# Patient Record
Sex: Female | Born: 1960 | Race: White | Hispanic: No | Marital: Married | Smoking: Never smoker
Health system: Southern US, Community
[De-identification: ages and names within clinical notes are randomized; demographics above are authoritative.]

## PROBLEM LIST (undated history)

## (undated) DIAGNOSIS — IMO0002 Reserved for concepts with insufficient information to code with codable children: Secondary | ICD-10-CM

## (undated) DIAGNOSIS — Z90710 Acquired absence of both cervix and uterus: Secondary | ICD-10-CM

## (undated) DIAGNOSIS — M8580 Other specified disorders of bone density and structure, unspecified site: Secondary | ICD-10-CM

## (undated) DIAGNOSIS — I729 Aneurysm of unspecified site: Secondary | ICD-10-CM

## (undated) DIAGNOSIS — N390 Urinary tract infection, site not specified: Secondary | ICD-10-CM

## (undated) DIAGNOSIS — R569 Unspecified convulsions: Secondary | ICD-10-CM

## (undated) DIAGNOSIS — T7840XA Allergy, unspecified, initial encounter: Secondary | ICD-10-CM

## (undated) HISTORY — DX: Allergy, unspecified, initial encounter: T78.40XA

## (undated) HISTORY — DX: Acquired absence of both cervix and uterus: Z90.710

## (undated) HISTORY — DX: Aneurysm of unspecified site: I72.9

## (undated) HISTORY — DX: Reserved for concepts with insufficient information to code with codable children: IMO0002

---

## 1999-05-04 ENCOUNTER — Ambulatory Visit (HOSPITAL_COMMUNITY): Admission: RE | Admit: 1999-05-04 | Discharge: 1999-05-04 | Payer: Self-pay | Admitting: Family Medicine

## 2005-10-14 ENCOUNTER — Other Ambulatory Visit: Admission: RE | Admit: 2005-10-14 | Discharge: 2005-10-14 | Payer: Self-pay | Admitting: Obstetrics and Gynecology

## 2006-08-25 ENCOUNTER — Ambulatory Visit (HOSPITAL_COMMUNITY): Admission: RE | Admit: 2006-08-25 | Discharge: 2006-08-25 | Payer: Self-pay | Admitting: Obstetrics and Gynecology

## 2007-02-02 ENCOUNTER — Other Ambulatory Visit: Admission: RE | Admit: 2007-02-02 | Discharge: 2007-02-02 | Payer: Self-pay | Admitting: Obstetrics and Gynecology

## 2007-03-08 HISTORY — PX: CHOLECYSTECTOMY: SHX55

## 2007-03-20 ENCOUNTER — Ambulatory Visit (HOSPITAL_COMMUNITY): Admission: RE | Admit: 2007-03-20 | Discharge: 2007-03-20 | Payer: Self-pay | Admitting: Family Medicine

## 2008-02-19 ENCOUNTER — Other Ambulatory Visit: Admission: RE | Admit: 2008-02-19 | Discharge: 2008-02-19 | Payer: Self-pay | Admitting: Obstetrics & Gynecology

## 2009-11-07 DIAGNOSIS — R569 Unspecified convulsions: Secondary | ICD-10-CM

## 2009-11-07 HISTORY — DX: Unspecified convulsions: R56.9

## 2010-03-07 HISTORY — PX: CEREBRAL ANEURYSM REPAIR: SHX164

## 2010-03-24 ENCOUNTER — Emergency Department (HOSPITAL_COMMUNITY): Admission: EM | Admit: 2010-03-24 | Discharge: 2010-03-24 | Payer: Self-pay | Admitting: Emergency Medicine

## 2011-01-24 LAB — DIFFERENTIAL
Eosinophils Absolute: 0 10*3/uL (ref 0.0–0.7)
Eosinophils Relative: 0 % (ref 0–5)
Lymphocytes Relative: 6 % — ABNORMAL LOW (ref 12–46)
Lymphs Abs: 0.9 10*3/uL (ref 0.7–4.0)
Monocytes Relative: 5 % (ref 3–12)
Neutrophils Relative %: 89 % — ABNORMAL HIGH (ref 43–77)

## 2011-01-24 LAB — POCT I-STAT, CHEM 8
BUN: 9 mg/dL (ref 6–23)
Creatinine, Ser: 0.6 mg/dL (ref 0.4–1.2)
Glucose, Bld: 126 mg/dL — ABNORMAL HIGH (ref 70–99)
Hemoglobin: 13.6 g/dL (ref 12.0–15.0)
Potassium: 3.4 mEq/L — ABNORMAL LOW (ref 3.5–5.1)
Sodium: 136 mEq/L (ref 135–145)

## 2011-01-24 LAB — CBC
HCT: 39.3 % (ref 36.0–46.0)
MCV: 84.2 fL (ref 78.0–100.0)
RBC: 4.66 MIL/uL (ref 3.87–5.11)
WBC: 14.2 10*3/uL — ABNORMAL HIGH (ref 4.0–10.5)

## 2011-01-24 LAB — APTT: aPTT: 22 seconds — ABNORMAL LOW (ref 24–37)

## 2011-01-24 LAB — PROTIME-INR: INR: 1.05 (ref 0.00–1.49)

## 2011-03-26 DIAGNOSIS — I729 Aneurysm of unspecified site: Secondary | ICD-10-CM

## 2011-03-26 HISTORY — DX: Aneurysm of unspecified site: I72.9

## 2011-11-18 DIAGNOSIS — G40001 Localization-related (focal) (partial) idiopathic epilepsy and epileptic syndromes with seizures of localized onset, not intractable, with status epilepticus: Secondary | ICD-10-CM | POA: Insufficient documentation

## 2012-04-24 ENCOUNTER — Other Ambulatory Visit: Payer: Self-pay | Admitting: Obstetrics and Gynecology

## 2012-04-24 DIAGNOSIS — Z1231 Encounter for screening mammogram for malignant neoplasm of breast: Secondary | ICD-10-CM

## 2012-04-24 LAB — HM PAP SMEAR: HM Pap smear: NEGATIVE

## 2012-05-11 ENCOUNTER — Ambulatory Visit
Admission: RE | Admit: 2012-05-11 | Discharge: 2012-05-11 | Disposition: A | Discharge status: Home / Self Care | Payer: 59 | Source: Ambulatory Visit | Attending: Obstetrics and Gynecology | Admitting: Obstetrics and Gynecology

## 2012-05-11 DIAGNOSIS — Z1231 Encounter for screening mammogram for malignant neoplasm of breast: Secondary | ICD-10-CM

## 2012-05-11 LAB — HM MAMMOGRAPHY: HM Mammogram: NORMAL

## 2012-11-28 ENCOUNTER — Encounter (HOSPITAL_COMMUNITY): Payer: Self-pay | Admitting: *Deleted

## 2012-12-03 ENCOUNTER — Encounter (HOSPITAL_COMMUNITY): Payer: Self-pay | Admitting: Obstetrics and Gynecology

## 2012-12-03 MED ORDER — DEXTROSE 5 % IV SOLN
2.0000 g | INTRAVENOUS | Status: AC
  Administered 2012-12-04: 2 g via INTRAVENOUS
  Filled 2012-12-03: qty 2

## 2012-12-03 NOTE — H&P (Signed)
Traci [name] is an 52 y.o. female. With menorrhagia unresponsive to medical management.  PUS showed uterus 9 x 6 x 6 cm with 3.5 cm submucous fibroid and two other intramural fibroids, 2 cm and 1 cm.  Endo bx was benign.  Here for difinitive tx with hysterectomy.    Pertinent Gynecological History: Menses: flow is excessive with use of changes pads every 30-6- minutes on heaviest days and has clots and flooding. pads or tampons on heaviest days Bleeding: dysfunctional uterine bleeding Contraception: OCP (estrogen/progesterone) DES exposure: denies Blood transfusions: none Sexually transmitted diseases: no past history Previous GYN Procedures: none  Last mammogram: normal Date: July 2013 Last pap: normal Date: June 2013 OB History: G2, P2   Menstrual History:  No LMP recorded. Patient is not currently having periods (Reason: Oral contraceptives).    Past Medical History  Diagnosis Date  . 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 UTI (lower urinary tract infection)     current with antibiotics    Past Surgical History  Procedure Date  . Cholecystectomy 2008  . 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 Maintained on Keppra-no seizure activity since surgery    History reviewed. No pertinent family history.  Social History:  reports that she has never smoked. She does not have any smokeless tobacco history on file. She reports that she drinks alcohol. She reports that she does not use illicit drugs.  Allergies: Allergies not on file  No prescriptions prior to admission    Review of Systems  All other systems reviewed and are negative.    Height 5' (1.524 m), weight 68.04 kg (150 lb). Physical Exam  Constitutional: She is oriented to person, place, and time. She appears well-developed and well-nourished.  HENT:  Head: Normocephalic and atraumatic.  Eyes: Conjunctivae normal are normal. Pupils are equal, round, and reactive to light.  Neck:  Normal range of motion. Neck supple. No tracheal deviation present. No thyromegaly present.  Cardiovascular: Normal rate, regular rhythm and normal heart sounds.   Respiratory: Effort normal and breath sounds normal. No respiratory distress. She has no wheezes. She has no rales.  GI: Soft. Bowel sounds are normal. She exhibits no distension and no mass. There is no tenderness. There is no rebound and no guarding.  Genitourinary: Vagina normal and uterus normal.  Musculoskeletal: Normal range of motion.  Neurological: She is alert and oriented to person, place, and time.  Skin: Skin is warm and dry.  Psychiatric: She has a normal mood and affect. Her behavior is normal.    No results found for this or any previous visit (from the past 24 hour(s)).  No results found.  Assessment/Plan: Menorrhagia, plan for R-TLH, retain ovaries but remove tubes.    ROMINE,[name] 12/03/2012, 11:56 AM

## 2012-12-04 ENCOUNTER — Encounter (HOSPITAL_COMMUNITY)
Admission: RE | Disposition: A | Discharge status: Home / Self Care | Payer: Self-pay | Source: Ambulatory Visit | Attending: Obstetrics and Gynecology

## 2012-12-04 ENCOUNTER — Encounter (HOSPITAL_COMMUNITY): Payer: Self-pay | Admitting: *Deleted

## 2012-12-04 ENCOUNTER — Ambulatory Visit (HOSPITAL_COMMUNITY)
Admission: RE | Admit: 2012-12-04 | Discharge: 2012-12-04 | Disposition: A | Discharge status: Home / Self Care | Payer: 59 | Source: Ambulatory Visit | Attending: Obstetrics and Gynecology | Admitting: Obstetrics and Gynecology

## 2012-12-04 ENCOUNTER — Encounter (HOSPITAL_COMMUNITY): Payer: Self-pay | Admitting: Pharmacy Technician

## 2012-12-04 ENCOUNTER — Encounter (HOSPITAL_COMMUNITY): Payer: Self-pay | Admitting: Anesthesiology

## 2012-12-04 ENCOUNTER — Ambulatory Visit (HOSPITAL_COMMUNITY): Payer: 59 | Admitting: Anesthesiology

## 2012-12-04 DIAGNOSIS — N920 Excessive and frequent menstruation with regular cycle: Secondary | ICD-10-CM | POA: Insufficient documentation

## 2012-12-04 DIAGNOSIS — D250 Submucous leiomyoma of uterus: Secondary | ICD-10-CM | POA: Insufficient documentation

## 2012-12-04 DIAGNOSIS — Z90710 Acquired absence of both cervix and uterus: Secondary | ICD-10-CM

## 2012-12-04 HISTORY — DX: Unspecified convulsions: R56.9

## 2012-12-04 HISTORY — DX: Urinary tract infection, site not specified: N39.0

## 2012-12-04 HISTORY — DX: Acquired absence of both cervix and uterus: Z90.710

## 2012-12-04 HISTORY — PX: ROBOTIC ASSISTED TOTAL HYSTERECTOMY: SHX6085

## 2012-12-04 HISTORY — PX: CYSTOSCOPY: SHX5120

## 2012-12-04 LAB — CBC
HCT: 32.1 % — ABNORMAL LOW (ref 36.0–46.0)
Hemoglobin: 9.8 g/dL — ABNORMAL LOW (ref 12.0–15.0)
MCHC: 30.5 g/dL (ref 30.0–36.0)
RBC: 4.12 MIL/uL (ref 3.87–5.11)

## 2012-12-04 LAB — COMPREHENSIVE METABOLIC PANEL
ALT: 10 U/L (ref 0–35)
Alkaline Phosphatase: 63 U/L (ref 39–117)
BUN: 12 mg/dL (ref 6–23)
CO2: 25 mEq/L (ref 19–32)
Chloride: 105 mEq/L (ref 96–112)
GFR calc Af Amer: >90 mL/min (ref >90)
GFR calc non Af Amer: >90 mL/min (ref >90)
Glucose, Bld: 95 mg/dL (ref 70–99)
Potassium: 4.1 mEq/L (ref 3.5–5.1)
Sodium: 138 mEq/L (ref 135–145)
Total Bilirubin: 0.3 mg/dL (ref 0.3–1.2)

## 2012-12-04 LAB — SURGICAL PCR SCREEN: MRSA, PCR: NEGATIVE

## 2012-12-04 SURGERY — ROBOTIC ASSISTED TOTAL HYSTERECTOMY
Anesthesia: General | Site: Urethra | Wound class: Clean Contaminated

## 2012-12-04 MED ORDER — LACTATED RINGERS IR SOLN
Status: DC | PRN
  Administered 2012-12-04: 3000 mL

## 2012-12-04 MED ORDER — DEXTROSE IN LACTATED RINGERS 5 % IV SOLN: INTRAVENOUS | Status: DC

## 2012-12-04 MED ORDER — GLYCOPYRROLATE 0.2 MG/ML IJ SOLN
INTRAMUSCULAR | Status: AC
  Filled 2012-12-04: qty 2

## 2012-12-04 MED ORDER — HYDROMORPHONE HCL PF 1 MG/ML IJ SOLN
INTRAMUSCULAR | Status: AC
  Filled 2012-12-04: qty 1

## 2012-12-04 MED ORDER — KETOROLAC TROMETHAMINE 30 MG/ML IJ SOLN: 15.0000 mg | Freq: Once | INTRAMUSCULAR | Status: DC | PRN

## 2012-12-04 MED ORDER — ACETAMINOPHEN 10 MG/ML IV SOLN
INTRAVENOUS | Status: AC
  Filled 2012-12-04: qty 100

## 2012-12-04 MED ORDER — ACETAMINOPHEN 10 MG/ML IV SOLN: 1000.0000 mg | Freq: Once | INTRAVENOUS | Status: DC

## 2012-12-04 MED ORDER — MORPHINE SULFATE 4 MG/ML IJ SOLN
1.0000 mg | INTRAMUSCULAR | Status: DC | PRN
  Administered 2012-12-04: 2 mg via INTRAVENOUS
  Filled 2012-12-04: qty 1

## 2012-12-04 MED ORDER — ROCURONIUM BROMIDE 50 MG/5ML IV SOLN
INTRAVENOUS | Status: AC
  Filled 2012-12-04: qty 1

## 2012-12-04 MED ORDER — MIDAZOLAM HCL 2 MG/2ML IJ SOLN
INTRAMUSCULAR | Status: AC
  Filled 2012-12-04: qty 2

## 2012-12-04 MED ORDER — OXYCODONE-ACETAMINOPHEN 5-325 MG PO TABS: 1.0000 | ORAL_TABLET | ORAL | Status: DC | PRN

## 2012-12-04 MED ORDER — ROPIVACAINE HCL 5 MG/ML IJ SOLN
INTRAMUSCULAR | Status: AC
  Filled 2012-12-04: qty 60

## 2012-12-04 MED ORDER — NEOSTIGMINE METHYLSULFATE 1 MG/ML IJ SOLN
INTRAMUSCULAR | Status: AC
  Filled 2012-12-04: qty 1

## 2012-12-04 MED ORDER — FENTANYL CITRATE 0.05 MG/ML IJ SOLN
INTRAMUSCULAR | Status: AC
  Filled 2012-12-04: qty 5

## 2012-12-04 MED ORDER — NEOSTIGMINE METHYLSULFATE 1 MG/ML IJ SOLN
INTRAMUSCULAR | Status: DC | PRN
  Administered 2012-12-04: 3 mg via INTRAVENOUS

## 2012-12-04 MED ORDER — MIDAZOLAM HCL 5 MG/5ML IJ SOLN
INTRAMUSCULAR | Status: DC | PRN
  Administered 2012-12-04: 2 mg via INTRAVENOUS

## 2012-12-04 MED ORDER — LIDOCAINE HCL (CARDIAC) 20 MG/ML IV SOLN
INTRAVENOUS | Status: DC | PRN
  Administered 2012-12-04: 80 mg via INTRAVENOUS

## 2012-12-04 MED ORDER — MUPIROCIN 2 % EX OINT: TOPICAL_OINTMENT | Freq: Two times a day (BID) | CUTANEOUS | Status: DC

## 2012-12-04 MED ORDER — MUPIROCIN 2 % EX OINT
TOPICAL_OINTMENT | CUTANEOUS | Status: AC
  Administered 2012-12-04: 1
  Filled 2012-12-04: qty 22

## 2012-12-04 MED ORDER — SODIUM CHLORIDE 0.9 % IJ SOLN
INTRAMUSCULAR | Status: DC | PRN
  Administered 2012-12-04: 60 mL

## 2012-12-04 MED ORDER — DEXAMETHASONE SODIUM PHOSPHATE 4 MG/ML IJ SOLN
INTRAMUSCULAR | Status: DC | PRN
  Administered 2012-12-04: 10 mg via INTRAVENOUS

## 2012-12-04 MED ORDER — ONDANSETRON HCL 4 MG/2ML IJ SOLN
INTRAMUSCULAR | Status: DC | PRN
  Administered 2012-12-04: 4 mg via INTRAVENOUS

## 2012-12-04 MED ORDER — ONDANSETRON HCL 4 MG/2ML IJ SOLN
INTRAMUSCULAR | Status: AC
  Filled 2012-12-04: qty 2

## 2012-12-04 MED ORDER — STERILE WATER FOR IRRIGATION IR SOLN
Status: DC | PRN
  Administered 2012-12-04: 1000 mL via INTRAVESICAL

## 2012-12-04 MED ORDER — FENTANYL CITRATE 0.05 MG/ML IJ SOLN
INTRAMUSCULAR | Status: DC | PRN
  Administered 2012-12-04 (×2): 100 ug via INTRAVENOUS
  Administered 2012-12-04: 50 ug via INTRAVENOUS

## 2012-12-04 MED ORDER — INDIGOTINDISULFONATE SODIUM 8 MG/ML IJ SOLN
INTRAMUSCULAR | Status: AC
  Filled 2012-12-04: qty 5

## 2012-12-04 MED ORDER — HYDROMORPHONE HCL PF 1 MG/ML IJ SOLN
INTRAMUSCULAR | Status: DC | PRN
  Administered 2012-12-04: 1 mg via INTRAVENOUS

## 2012-12-04 MED ORDER — GLYCOPYRROLATE 0.2 MG/ML IJ SOLN
INTRAMUSCULAR | Status: DC | PRN
  Administered 2012-12-04: 0.4 mg via INTRAVENOUS

## 2012-12-04 MED ORDER — LIDOCAINE HCL (CARDIAC) 20 MG/ML IV SOLN
INTRAVENOUS | Status: AC
  Filled 2012-12-04: qty 5

## 2012-12-04 MED ORDER — LACTATED RINGERS IV SOLN
INTRAVENOUS | Status: DC
  Administered 2012-12-04 (×3): via INTRAVENOUS

## 2012-12-04 MED ORDER — FENTANYL CITRATE 0.05 MG/ML IJ SOLN: 25.0000 ug | INTRAMUSCULAR | Status: DC | PRN

## 2012-12-04 MED ORDER — ACETAMINOPHEN 10 MG/ML IV SOLN
INTRAVENOUS | Status: DC | PRN
  Administered 2012-12-04: 1000 mg via INTRAVENOUS

## 2012-12-04 MED ORDER — PROPOFOL 10 MG/ML IV EMUL
INTRAVENOUS | Status: DC | PRN
  Administered 2012-12-04: 160 mg via INTRAVENOUS

## 2012-12-04 MED ORDER — ARTIFICIAL TEARS OP OINT
TOPICAL_OINTMENT | OPHTHALMIC | Status: AC
  Filled 2012-12-04: qty 3.5

## 2012-12-04 MED ORDER — INDIGOTINDISULFONATE SODIUM 8 MG/ML IJ SOLN
INTRAMUSCULAR | Status: DC | PRN
  Administered 2012-12-04: 3 mL via INTRAVENOUS

## 2012-12-04 MED ORDER — DEXAMETHASONE SODIUM PHOSPHATE 10 MG/ML IJ SOLN
INTRAMUSCULAR | Status: AC
  Filled 2012-12-04: qty 1

## 2012-12-04 MED ORDER — ROPIVACAINE HCL 5 MG/ML IJ SOLN
INTRAMUSCULAR | Status: DC | PRN
  Administered 2012-12-04: 80 mL

## 2012-12-04 MED ORDER — ROCURONIUM BROMIDE 100 MG/10ML IV SOLN
INTRAVENOUS | Status: DC | PRN
  Administered 2012-12-04: 50 mg via INTRAVENOUS
  Administered 2012-12-04: 25 mg via INTRAVENOUS

## 2012-12-04 MED ORDER — PROPOFOL 10 MG/ML IV EMUL
INTRAVENOUS | Status: AC
  Filled 2012-12-04: qty 20

## 2012-12-04 SURGICAL SUPPLY — 71 items
BAG URINE DRAINAGE (UROLOGICAL SUPPLIES) ×3 IMPLANT
BARRIER ADHS 3X4 INTERCEED (GAUZE/BANDAGES/DRESSINGS) ×3 IMPLANT
BENZOIN TINCTURE PRP APPL 2/3 (GAUZE/BANDAGES/DRESSINGS) ×3 IMPLANT
CHLORAPREP W/TINT 26ML (MISCELLANEOUS) ×3 IMPLANT
CONT PATH 16OZ SNAP LID 3702 (MISCELLANEOUS) ×3 IMPLANT
COVER MAYO STAND STRL (DRAPES) ×3 IMPLANT
COVER TABLE BACK 60X90 (DRAPES) ×6 IMPLANT
COVER TIP SHEARS 8 DVNC (MISCELLANEOUS) ×2 IMPLANT
COVER TIP SHEARS 8MM DA VINCI (MISCELLANEOUS) ×1
DECANTER SPIKE VIAL GLASS SM (MISCELLANEOUS) ×3 IMPLANT
DERMABOND ADVANCED (GAUZE/BANDAGES/DRESSINGS) ×1
DERMABOND ADVANCED .7 DNX12 (GAUZE/BANDAGES/DRESSINGS) ×2 IMPLANT
DRAPE HUG U DISPOSABLE (DRAPE) ×3 IMPLANT
DRAPE LG THREE QUARTER DISP (DRAPES) ×6 IMPLANT
DRAPE WARM FLUID 44X44 (DRAPE) ×3 IMPLANT
ELECT REM PT RETURN 9FT ADLT (ELECTROSURGICAL) ×3
ELECTRODE REM PT RTRN 9FT ADLT (ELECTROSURGICAL) ×2 IMPLANT
EVACUATOR SMOKE 8.L (FILTER) ×3 IMPLANT
GAUZE VASELINE 3X9 (GAUZE/BANDAGES/DRESSINGS) IMPLANT
GLOVE BIO SURGEON STRL SZ 6.5 (GLOVE) ×9 IMPLANT
GLOVE BIOGEL PI IND STRL 7.0 (GLOVE) ×8 IMPLANT
GLOVE BIOGEL PI INDICATOR 7.0 (GLOVE) ×4
GLOVE ECLIPSE 6.5 STRL STRAW (GLOVE) ×12 IMPLANT
GOWN STRL REIN XL XLG (GOWN DISPOSABLE) ×24 IMPLANT
GYRUS RUMI II 2.5CM BLUE (DISPOSABLE)
GYRUS RUMI II 3.5CM BLUE (DISPOSABLE)
GYRUS RUMI II 4.0CM BLUE (DISPOSABLE)
KIT ACCESSORY DA VINCI DISP (KITS) ×1
KIT ACCESSORY DVNC DISP (KITS) ×2 IMPLANT
LEGGING LITHOTOMY PAIR STRL (DRAPES) ×3 IMPLANT
NEEDLE INSUFFLATION 14GA 120MM (NEEDLE) ×3 IMPLANT
OCCLUDER COLPOPNEUMO (BALLOONS) ×3 IMPLANT
PACK LAVH (CUSTOM PROCEDURE TRAY) ×3 IMPLANT
PAD PREP 24X48 CUFFED NSTRL (MISCELLANEOUS) ×6 IMPLANT
PLUG CATH AND CAP STER (CATHETERS) ×3 IMPLANT
PROTECTOR NERVE ULNAR (MISCELLANEOUS) ×6 IMPLANT
RUMI II 3.0CM BLUE KOH-EFFICIE (DISPOSABLE) IMPLANT
RUMI II GYRUS 2.5CM BLUE (DISPOSABLE) IMPLANT
RUMI II GYRUS 3.5CM BLUE (DISPOSABLE) IMPLANT
RUMI II GYRUS 4.0CM BLUE (DISPOSABLE) IMPLANT
SET CYSTO W/LG BORE CLAMP LF (SET/KITS/TRAYS/PACK) ×3 IMPLANT
SET IRRIG TUBING LAPAROSCOPIC (IRRIGATION / IRRIGATOR) ×3 IMPLANT
SOLUTION ELECTROLUBE (MISCELLANEOUS) ×3 IMPLANT
STRIP CLOSURE SKIN 1/4X4 (GAUZE/BANDAGES/DRESSINGS) ×3 IMPLANT
SUT VIC AB 0 CT1 27 (SUTURE) ×1
SUT VIC AB 0 CT1 27XBRD ANBCTR (SUTURE) ×2 IMPLANT
SUT VIC AB 0 CT1 27XBRD ANTBC (SUTURE) IMPLANT
SUT VICRYL 0 UR6 27IN ABS (SUTURE) ×3 IMPLANT
SUT VICRYL RAPIDE 4/0 PS 2 (SUTURE) ×6 IMPLANT
SUT VLOC 180 0 9IN  GS21 (SUTURE) ×1
SUT VLOC 180 0 9IN GS21 (SUTURE) ×2 IMPLANT
SYR 30ML LL (SYRINGE) ×3 IMPLANT
SYR 50ML LL SCALE MARK (SYRINGE) ×3 IMPLANT
SYRINGE 10CC LL (SYRINGE) ×3 IMPLANT
SYSTEM CONVERTIBLE TROCAR (TROCAR) ×3 IMPLANT
TIP RUMI ORANGE 6.7MMX12CM (TIP) IMPLANT
TIP UTERINE 5.1X6CM LAV DISP (MISCELLANEOUS) IMPLANT
TIP UTERINE 6.7X10CM GRN DISP (MISCELLANEOUS) ×3 IMPLANT
TIP UTERINE 6.7X6CM WHT DISP (MISCELLANEOUS) IMPLANT
TIP UTERINE 6.7X8CM BLUE DISP (MISCELLANEOUS) IMPLANT
TOWEL OR 17X24 6PK STRL BLUE (TOWEL DISPOSABLE) ×9 IMPLANT
TRAY FOLEY BAG SILVER LF 14FR (CATHETERS) ×3 IMPLANT
TROCAR DISP BLADELESS 8 DVNC (TROCAR) ×2 IMPLANT
TROCAR DISP BLADELESS 8MM (TROCAR) ×1
TROCAR XCEL 12X100 BLDLESS (ENDOMECHANICALS) IMPLANT
TROCAR XCEL NON-BLD 5MMX100MML (ENDOMECHANICALS) ×3 IMPLANT
TROCAR Z THRD FIOS 12X150 (TROCAR) ×3 IMPLANT
TROCAR Z-THREAD 12X150 (TROCAR) IMPLANT
TUBING FILTER THERMOFLATOR (ELECTROSURGICAL) ×3 IMPLANT
WARMER LAPAROSCOPE (MISCELLANEOUS) ×3 IMPLANT
WATER STERILE IRR 1000ML POUR (IV SOLUTION) ×9 IMPLANT

## 2012-12-04 NOTE — Transfer of Care (Signed)
Immediate Anesthesia Transfer of Care Note  Patient: Traci [name]  Procedure(s) Performed: Procedure(s) (LRB) with comments: ROBOTIC ASSISTED TOTAL HYSTERECTOMY (Bilateral) - with bilateral salpingectomy CYSTOSCOPY (N/A)  Patient Location: PACU  Anesthesia Type:General  Level of Consciousness: awake, alert  and oriented  Airway & Oxygen Therapy: Patient Spontanous Breathing and Patient connected to nasal cannula oxygen  Post-op Assessment: Report given to PACU RN and Post -op Vital signs reviewed and stable  Post vital signs: stable  Complications: No apparent anesthesia complications

## 2012-12-04 NOTE — Progress Notes (Signed)
Called 3rd floor to inform them of transfer of patient to 305.  Delorse Lek, RN is the nurse receiving this patient.  Thayer Ohm is off the floor will call me back.

## 2012-12-04 NOTE — Interval H&P Note (Signed)
History and Physical Interval Note:  12/04/2012 7:22 AM  Traci [name]  has presented today for surgery, with the diagnosis of menorrhagia,submucosal fibroid  The various methods of treatment have been discussed with the patient and family. After consideration of risks, benefits and other options for treatment, the patient has consented to  Procedure(s) (LRB) with comments: ROBOTIC ASSISTED TOTAL HYSTERECTOMY (N/A) as a surgical intervention .  The patient's history has been reviewed, patient examined, no change in status, stable for surgery.  I have reviewed the patient's chart and labs.  Questions were answered to the patient's satisfaction.     ROMINE,[name]

## 2012-12-04 NOTE — Progress Notes (Signed)
UR completed 

## 2012-12-04 NOTE — Op Note (Signed)
Preoperative diagnosis: Menorrhagia, fibroids Postoperative diagnosis: Same, path pending Procedure: Robotic assisted total laparoscopic hysterectomy, bilateral salpingectomy, cystoscopy Surgeon: Dr. Meredeth Ide Assistant: Dr. Conley Simmonds Anesthesia: Gen. endotracheal Estimated blood loss: 40 cc Complications: None  Procedure: The patient was taken to the operating room, and after she was given IV sedation, she was placed in the low dorsolithotomy position.  She was then prepped and draped in usual fashion.  A posterior weighted and anterior Deaver retractor were placed into the vagina.  The cervix was grasped on its anterior lip with a single-tooth tenaculum.  The uterus sounded to  10 cm    . A  10 cm    Rumi manipulator with a  small KOH ring was placed without difficulty.  A Foley catheter was then placed. Tthe skin at the umbilicus was anesthetized with quarter percent ropivacaine, incised and a veres needle was inserted into the peritoneal space.  Proper placement was noted using the hanging drop technique.  Pneumoperitoneum was accomplished using the automatic insufflator.  A 12 mm bladed trocar was then inserted into the peritoneal space and its proper placement noted with the laparoscope.  60 cc of quarter percent ropivacaine was inserted into the peritoneal space.  Sites were marked for the robotic trocars and for the assistance trocar, the skin was anesthetized with quarter percent ropivicaine, incised with a knife, and the trocars were inserted under direct visualization.  The patient was then placed in steep Trendelenburg position at about 60 as was requested per anesthesia do to her previous cerebral aneurysm repair. The robot was brought in and docked.  Monopolar scissors were inserted through port one and the PK gyrus through port two, both under direct visualization.  The surgeon then proceeded to the console.  The ureters were identified and seen peristalsing bilaterally.  The  procedure began on the patient's right, identifying the fallopian tube, and separating it from the ovary and along the length of the mesosalpinx with serial coagulation and cutting.  The utero-ovarian ligament was then cauterized and cut.  The round ligament on the right was cauterized and cut.  The anterior and posterior leaves of the broad ligament were taken down sharply.  The bladder was dissected sharply off the [facility] ring.  The uterine artery was skeletonized, coagulated multiple times, and cut.  Attention was next turned to the patient's left.  The tube was likewise cauterized and cut along its length to separate it from the ovary and the mesosalpinx.  The utero-ovarian ligament on the left was cauterized and cut.  The round ligament on the left was cauterized and cut.  The anterior posterior leaves of the broad ligament were taken down sharply.  Further dissection was done to dissect the bladder off the [facility] ring.  The left uterine artery was skeletonized, coagulated multiple times, and cut.  A circumferential colpotomy incision was then made with monopolar scissors.  The uterus was then delivered into the vagina with the fallopian tubes attached.  Robotic instruments were then changed out for a mega-suture cut needle driver in port 1 and a Cobra grasper in port 2.   A V. LOC suture was dropped in through the trocar at the umbilicus, and carried into the pelvis.  The vaginal cuff was closed with a running V. LOC suture.  The needle was parked in the right paracolic gutter.  The pelvis was irrigated and found to be hemostatic.  The ureters were again identified and seen peristalsing bilaterally.  A sheet of Interceed  was brought in through one of the robotic trocars and placed over the vaginal cuff.  Robotic instruments were then removed.  An 8mm laparoscope was used to enable a [place] forceps to go in through the umbilical port and retrieve the needle the that had been parked in the right paracolic gutter.   Trocars were then removed under direct visualization.  Pneumoperitoneum was allowed to escape.  While the umbilical trocar was still in place, the anesthetist was asked to give the patient manual deep breaths to assist in removing the CO2 from the abdomen.  That trocar was then removed.  The fascia was closed at the umbilicus with a single suture of 0 Vicryl.  The incisions were then closed with 4-0 Vicryl Rapide, and Dermabond.  The surgeon then proceeded to perform cystoscopy.  The Foley catheter was removed.  The cystoscope was inserted and 200 cc of sterile water was instilled into the bladder.  Approximately 10 minutes before the cystoscopy started, an amp of indigo carmine had been injected per anesthesia IV.  Good efflux of indigo carmine could be seen from both ureteral orifices. The dome of the bladder was inspected and was normal.  The scope was withdrawn, and the bladder was drained.  Procedure was terminated.  Sponge needle and instrument counts were correct x3.  The patient was taken to the recovery room in satisfactory condition.

## 2012-12-04 NOTE — Progress Notes (Signed)
Pt  Out in wheelchair    Ambulates well voided x2    Pain controlled   Eating without nausea and vomiting   D/c with husband

## 2012-12-04 NOTE — Progress Notes (Signed)
Pt refused serum pregnancy test.. She stated " im not pregnant and do not want to charged for the test"

## 2012-12-04 NOTE — Anesthesia Postprocedure Evaluation (Signed)
Anesthesia Post Note  Patient: Traci [name]  Procedure(s) Performed: Procedure(s) (LRB): ROBOTIC ASSISTED TOTAL HYSTERECTOMY (Bilateral) CYSTOSCOPY (N/A)  Anesthesia type: General  Patient location: PACU  Post pain: Pain level controlled  Post assessment: Post-op Vital signs reviewed  Last Vitals:  Filed Vitals:   12/04/12 0608  BP: 122/78  Pulse: 91  Temp: 36.9 C  Resp: 18    Post vital signs: Reviewed  Level of consciousness: sedated  Complications: No apparent anesthesia complications

## 2012-12-04 NOTE — Anesthesia Preprocedure Evaluation (Signed)
Anesthesia Evaluation  Patient identified by MRN, date of birth, ID band Patient awake    Reviewed: Allergy & Precautions, H&P , NPO status , Patient's Chart, lab work & pertinent test results, reviewed documented beta blocker date and time   History of Anesthesia Complications Negative for: history of anesthetic complications  Airway Mallampati: III TM Distance: >3 FB Neck ROM: full    Dental  (+) Teeth Intact   Pulmonary neg pulmonary ROS,  breath sounds clear to auscultation  Pulmonary exam normal       Cardiovascular Exercise Tolerance: Good negative cardio ROS  Rhythm:regular Rate:Normal     Neuro/Psych Seizures - (one seizure after aneurysm clipping, on seizure meds since), Well Controlled,  CVA (ruptured aneurysms cl[facility] in 2011, craniotomy.  sees neurology once a year, stable follow-up), No Residual Symptoms negative psych ROS   GI/Hepatic negative GI ROS, Neg liver ROS,   Endo/Other  negative endocrine ROS  Renal/GU negative Renal ROS  Female GU complaint     Musculoskeletal   Abdominal   Peds  Hematology negative hematology ROS (+)   Anesthesia Other Findings   Reproductive/Obstetrics negative OB ROS                           Anesthesia Physical Anesthesia Plan  ASA: II  Anesthesia Plan: General ETT   Post-op Pain Management:    Induction:   Airway Management Planned:   Additional Equipment:   Intra-op Plan:   Post-operative Plan:   Informed Consent: I have reviewed the patients History and Physical, chart, labs and discussed the procedure including the risks, benefits and alternatives for the proposed anesthesia with the patient or authorized representative who has indicated his/her understanding and acceptance.   Dental Advisory Given  Plan Discussed with: CRNA and Surgeon  Anesthesia Plan Comments:         Anesthesia Quick Evaluation

## 2012-12-05 ENCOUNTER — Encounter (HOSPITAL_COMMUNITY): Payer: Self-pay | Admitting: Obstetrics and Gynecology

## 2013-04-16 ENCOUNTER — Encounter: Payer: Self-pay | Admitting: *Deleted

## 2013-04-26 ENCOUNTER — Ambulatory Visit: Payer: Self-pay | Admitting: Nurse Practitioner

## 2013-04-29 ENCOUNTER — Ambulatory Visit (INDEPENDENT_AMBULATORY_CARE_PROVIDER_SITE_OTHER): Payer: 59 | Admitting: Nurse Practitioner

## 2013-04-29 ENCOUNTER — Encounter: Payer: Self-pay | Admitting: Nurse Practitioner

## 2013-04-29 VITALS — BP 108/72 | HR 78 | Resp 16 | Ht 61.0 in | Wt 155.0 lb

## 2013-04-29 DIAGNOSIS — Z01419 Encounter for gynecological examination (general) (routine) without abnormal findings: Secondary | ICD-10-CM

## 2013-04-29 DIAGNOSIS — Z1211 Encounter for screening for malignant neoplasm of colon: Secondary | ICD-10-CM

## 2013-04-29 NOTE — Patient Instructions (Addendum)

## 2013-04-29 NOTE — Progress Notes (Signed)
52 y.o. G2P2 Married Caucasian Fe here for annual exam.  Having various joint pain and stiffness. She has done really well since hysterectomy in January. Really appreciates Dr. Precious Bard.  No LMP recorded. Patient has had a hysterectomy.          Sexually active: yes  The current method of family planning is status post hysterectomy.    Exercising: no  The patient does not participate in regular exercise at present. Smoker:  no  Health Maintenance: Pap:  04/24/12 normal neg HPV MMG:  05/11/12 normal Colonoscopy:  n/a BMD:   n/a TDaP:  Unsure will check with PCP Labs: earlier this month Dr. Charleston Poot [facility] will have record of labs sent here   reports that she has never smoked. She does not have any smokeless tobacco history on file. She reports that  drinks alcohol. She reports that she does not use illicit drugs.  Past Medical History  Diagnosis Date  . 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 UTI (lower urinary tract infection)     current with antibiotics  . Aneurysm 03/26/2011    Past Surgical History  Procedure Laterality Date  . Cholecystectomy  2008  . 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 Maintained on Keppra-no seizure activity since surgery  . Robotic assisted total hysterectomy  12/04/2012    Procedure: ROBOTIC ASSISTED TOTAL HYSTERECTOMY;  Surgeon: Alison Murray, MD;  Location: [facility];  Service: Gynecology;  Laterality: Bilateral;  with bilateral salpingectomy  . Cystoscopy  12/04/2012    Procedure: CYSTOSCOPY;  Surgeon: Alison Murray, MD;  Location: [facility];  Service: Gynecology;  Laterality: N/A;  . Abdominal hysterectomy      TLH    Current Outpatient Prescriptions  Medication Sig Dispense Refill  . betamethasone valerate (VALISONE) 0.1 % cream Apply 1 application topically 2 (two) times daily as needed. For eczema      . levETIRAcetam (KEPPRA) 750 MG tablet Take 750 mg by mouth every 12 (twelve) hours.      . Multiple  Vitamin (MULTIVITAMIN) tablet Take 1 tablet by mouth daily.      . VOLTAREN 1 % GEL        No current facility-administered medications for this visit.    Family History  Problem Relation Age of Onset  . Osteoarthritis Mother   . Hypertension Father   . Heart failure Maternal Grandfather   . Cancer Paternal Grandfather     colon cancer    ROS:  Pertinent items are noted in HPI.  Otherwise, a comprehensive ROS was negative.  Exam:   BP 108/72  Pulse 78  Resp 16  Ht 5\' 1"  (1.549 m)  Wt 155 lb (70.308 kg)  BMI 29.3 kg/m2 Height: 5\' 1"  (154.9 cm)  Ht Readings from Last 3 Encounters:  04/29/13 5\' 1"  (1.549 m)  11/28/12 5' (1.524 m)  11/28/12 5' (1.524 m)    General appearance: alert, cooperative and appears stated age Head: Normocephalic, without obvious abnormality, atraumatic Neck: no adenopathy, supple, symmetrical, trachea midline and thyroid normal to inspection and palpation Lungs: clear to auscultation bilaterally Breasts: normal appearance, no masses or tenderness Heart: regular rate and rhythm Abdomen: soft, non-tender; no masses,  no organomegaly Extremities: extremities normal, atraumatic, no cyanosis or edema Skin: Skin color, texture, turgor normal. No rashes or lesions Lymph nodes: Cervical, supraclavicular, and axillary nodes normal. No abnormal inguinal nodes palpated Neurologic: Grossly normal   Pelvic: External genitalia:  no lesions              Urethra:  normal appearing urethra with no masses, tenderness or lesions              Bartholin's and Skene's: normal                 Vagina: normal appearing vagina with normal color and discharge, no lesions              Cervix: absent              Pap taken: no Bimanual Exam:  Uterus:  uterus absent              Adnexa: no mass, fullness, tenderness               Rectovaginal: Confirms               Anus:  normal sphincter tone, no lesions  A:  Well Woman with normal exam  S/ P  R-TVH with bilateral  salpingectomy secondary to fibroids 12/04/12  P:   Pap smear as per guidelines   Schedule GI consult with Dr. Loreta Ave for colonoscopy chart to nurse to schedule  Mammogram due 7/14  counseled on breast self exam, adequate intake of calcium and vitamin D,   diet and exercise return annually or prn  An After Visit Summary was printed and given to the patient.

## 2013-05-01 NOTE — Progress Notes (Signed)
Encounter reviewed by Dr. Brook [name].  

## 2013-05-03 ENCOUNTER — Ambulatory Visit: Payer: Self-pay | Admitting: Nurse Practitioner

## 2013-06-07 DIAGNOSIS — IMO0002 Reserved for concepts with insufficient information to code with codable children: Secondary | ICD-10-CM

## 2013-06-07 HISTORY — DX: Reserved for concepts with insufficient information to code with codable children: IMO0002

## 2013-07-09 ENCOUNTER — Telehealth: Payer: Self-pay | Admitting: Nurse Practitioner

## 2013-07-09 DIAGNOSIS — R933 Abnormal findings on diagnostic imaging of other parts of digestive tract: Secondary | ICD-10-CM

## 2013-07-09 NOTE — Telephone Encounter (Signed)
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 I had already discussed this with Dr. Farrel Gobble and we felt that she would benefit from a colposcopy to look at the vulva and perineum to make sure no HPV lesions. She had negative HR HPV at last AEX in 04/2012. i will submit order and follow.

## 2013-08-26 ENCOUNTER — Telehealth: Payer: Self-pay | Admitting: *Deleted

## 2013-08-26 NOTE — Telephone Encounter (Signed)
Patient calling to follow up on colposcopy that Patty called her to recommend.  Procedure was scheduled by receptionist but patient asking questions about procedure.  Overview of colposcopy explained to patient and questions answered in general terms.  Provider will review more specifically at appointment. Patient has had a hysterectomy, colpo ordered due to path on colonoscopy.

## 2013-09-04 ENCOUNTER — Encounter: Payer: Self-pay | Admitting: Gynecology

## 2013-09-04 ENCOUNTER — Ambulatory Visit (INDEPENDENT_AMBULATORY_CARE_PROVIDER_SITE_OTHER): Payer: 59 | Admitting: Gynecology

## 2013-09-04 VITALS — BP 122/72 | HR 68 | Resp 12 | Ht 61.0 in | Wt 151.0 lb

## 2013-09-04 DIAGNOSIS — A630 Anogenital (venereal) warts: Secondary | ICD-10-CM

## 2013-09-04 DIAGNOSIS — B977 Papillomavirus as the cause of diseases classified elsewhere: Secondary | ICD-10-CM

## 2013-09-04 NOTE — Patient Instructions (Signed)
Colposcopy Post Procedure Instructions  * You may take Ibuprofen, Aleve, or Tylenol for cramping as if needed.  * If Monsel's solution is used, you may have a slight black discharge for a day or two.   * Light bleeding is normal. If bleeding is heavier than your period, please call our office.  * Refrain from putting anything in your vagina until the bleeding or discharge stops (usually 2 or 3 days).  * You will be notified within one week of your biopsy results, or we will discuss your results at your follow-up appointment if needed.  

## 2013-09-04 NOTE — Progress Notes (Signed)
Pt here for vaginal/vulvar colposcopy after having HPV effect noted on lesion during colonoscopy, no high grade changes were noted.  Pt is s/p robotic hysterectomy in 2014, and had neg HPV on last PAP in 2013. Rectal biopsy with no markers for HRHPV done, colpo offered to pt and she accepts. Affects of HPV on dysplasia and association with vaginal, vulvar and anal cancers reviewed, questions addressed.  Consent signed.  Procedure:  Graves speculum placed.  Cuff inspected, no lesions noted.  Acetic acid placed speculum slowly retracted to view all 4 walls and rotated, no AWE noted, no lugols absent staining noted. No biopsy taken. Vulva bathed in acetic acid- thorough inspection carried down to anus-no AWE or gross lesions noted, no biopsy  Pt tolerated well. Will inspect vulva as part of routine gyn visit which we encourage even though she has had a hysterectomy, she agrees .

## 2013-11-04 ENCOUNTER — Ambulatory Visit (INDEPENDENT_AMBULATORY_CARE_PROVIDER_SITE_OTHER): Payer: 59 | Admitting: Physician Assistant

## 2013-11-04 VITALS — BP 120/76 | HR 85 | Temp 98.3°F | Resp 16 | Ht 61.0 in | Wt 155.8 lb

## 2013-11-04 DIAGNOSIS — Z111 Encounter for screening for respiratory tuberculosis: Secondary | ICD-10-CM

## 2013-11-04 NOTE — Progress Notes (Signed)
   Patient ID: Traci [name] MRN: [id number], DOB: 1961-03-30, 52 y.o. Date of Encounter: 11/04/2013, 8:17 PM  Primary Physician: No PCP Per Patient  Chief Complaint: Tetanus vaccine  HPI: 52 y.o. female here for TDaP vaccination. Needs secondary to having a grand niece. No injury or trauma. She is uncertain when her last tetanus was.    Past Medical History  Diagnosis Date  . 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 UTI (lower urinary tract infection)     current with antibiotics  . 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 S/P laparoscopic assisted vaginal hysterectomy (LAVH) 12/04/12    Robotic and bilateral salpingectomy (ovaries remain)  . Allergy      Home Meds: Prior to Admission medications   Medication Sig Start Date End Date Taking? Authorizing Provider  betamethasone valerate (VALISONE) 0.1 % cream Apply 1 application topically 2 (two) times daily as needed. For eczema   Yes Historical Provider, MD  levETIRAcetam (KEPPRA) 750 MG tablet Take 750 mg by mouth every 12 (twelve) hours.   Yes Historical Provider, MD  VOLTAREN 1 % GEL  04/09/13  Yes Historical Provider, MD    Allergies:  Allergies  Allergen Reactions  . Penicillins     childhood    History   Social History  . Marital Status: Married    Spouse Name: Casimiro Needle    Number of Children: N/A  . Years of Education: N/A   Occupational History  . Book Seller @ [employer]    Social History Main Topics  . Smoking status: Never Smoker   . Smokeless tobacco: Not on file  . Alcohol Use: Yes     Comment: occas  . Drug Use: No  . Sexual Activity: Not on file   Other Topics Concern  . Not on file   Social History Narrative  . No narrative on file     Review of Systems: Constitutional: negative for chills or fever  Dermatological: negative for injury    Physical Exam: Blood pressure 120/76, pulse 85, temperature 98.3 F (36.8 C), temperature source Oral, resp. rate 16, height 5\' 1"  (1.549 m), weight 155 lb 12.8  oz (70.67 kg), SpO2 100.00%., Body mass index is 29.45 kg/(m^2). General: Well developed, well nourished, in no acute distress. Head: Normocephalic, atraumatic, eyes without discharge, sclera non-icteric, nares are without discharge.   Neck: Supple. Full ROM.  Lungs: Clear bilaterally to auscultation without wheezes, rales, or rhonchi. Breathing is unlabored. Heart: RRR with S1 S2. No murmurs, rubs, or gallops appreciated. Msk:  Strength and tone normal for age. Extremities/Skin: Warm and dry. No clubbing or cyanosis. No edema. No rashes or suspicious lesions. Neuro: Alert and oriented X 3. Moves all extremities spontaneously. Gait is normal. CNII-XII grossly in tact. Psych:  Responds to questions appropriately with a normal affect.     ASSESSMENT AND PLAN:  52 y.o. female here for tetanus vaccination. -TDaP given -RTC prn  Signed, Eula Listen, PA-C [facility] [place], [place] [address] [phone] 11/04/2013 8:17 PM

## 2014-02-11 ENCOUNTER — Encounter: Payer: Self-pay | Admitting: Nurse Practitioner

## 2014-05-01 ENCOUNTER — Ambulatory Visit: Payer: 59 | Admitting: Nurse Practitioner

## 2014-05-06 ENCOUNTER — Encounter: Payer: Self-pay | Admitting: Nurse Practitioner

## 2014-05-06 ENCOUNTER — Ambulatory Visit (INDEPENDENT_AMBULATORY_CARE_PROVIDER_SITE_OTHER): Payer: 59 | Admitting: Nurse Practitioner

## 2014-05-06 VITALS — BP 118/80 | HR 60 | Resp 16 | Ht 61.0 in | Wt 156.0 lb

## 2014-05-06 DIAGNOSIS — Z0000 Encounter for general adult medical examination without abnormal findings: Secondary | ICD-10-CM

## 2014-05-06 DIAGNOSIS — Z01419 Encounter for gynecological examination (general) (routine) without abnormal findings: Secondary | ICD-10-CM

## 2014-05-06 LAB — POCT URINALYSIS DIPSTICK
Bilirubin, UA: NEGATIVE
Blood, UA: NEGATIVE
Glucose, UA: NEGATIVE
Ketones, UA: NEGATIVE
Leukocytes, UA: NEGATIVE
Nitrite, UA: NEGATIVE
Protein, UA: NEGATIVE
Urobilinogen, UA: NEGATIVE
pH, UA: 5

## 2014-05-06 NOTE — Patient Instructions (Signed)

## 2014-05-06 NOTE — Progress Notes (Signed)
53 y.o. G2P2 Married Caucasian Fe here for annual exam.  No new health problems.  No seizures. Now married 4 years and doing well.  Job is very busy at [employer].  Patient's last menstrual period was 11/29/2012.          Sexually active: No.  The current method of family planning is status post hysterectomy.    Exercising: No.  The patient does not participate in regular exercise at present. Smoker:  no  Health Maintenance: Pap:  04/2012 Neg. HR HPV Neg MMG:  05/2012 BIRADS1 Colonoscopy:  06/2013 normal BMD:   N/A TDaP:  10/2013 Labs:  Hem: 13.4     UA: Clear  (will plan fasting labs for next year)   reports that she has never smoked. She has never used smokeless tobacco. She reports that she drinks about .6 - 1.2 ounces of alcohol per week. She reports that she does not use illicit drugs.  Past Medical History  Diagnosis Date  . 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 UTI (lower urinary tract infection)     current with antibiotics  . 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 S/P laparoscopic assisted vaginal hysterectomy (LAVH) 12/04/12    Robotic and bilateral salpingectomy (ovaries remain)  . Allergy     Past Surgical History  Procedure Laterality Date  . Cholecystectomy  03/2007    lap chole  . 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 Maintained on Keppra-no seizure activity since surgery  . Robotic assisted total hysterectomy  12/04/2012    Procedure: ROBOTIC ASSISTED TOTAL HYSTERECTOMY;  Surgeon: Peri Maris, MD;  Location: [facility];  Service: Gynecology;  Laterality: Bilateral;  with bilateral salpingectomy  . Cystoscopy  12/04/2012    Procedure: CYSTOSCOPY;  Surgeon: Peri Maris, MD;  Location: [facility];  Service: Gynecology;  Laterality: N/A;    Current Outpatient Prescriptions  Medication Sig Dispense Refill  . levETIRAcetam (KEPPRA) 750 MG tablet Take 750 mg by mouth every 12 (twelve) hours.      . Multiple Vitamin (MULTIVITAMIN) tablet  Take 1 tablet by mouth daily.      . VOLTAREN 1 % GEL        No current facility-administered medications for this visit.    Family History  Problem Relation Age of Onset  . Osteoarthritis Mother   . Hypertension Father   . Heart failure Maternal Grandfather   . Cancer Paternal Grandfather     colon cancer    ROS:  Pertinent items are noted in HPI.  Otherwise, a comprehensive ROS was negative.  Exam:   BP 118/80  Pulse 60  Resp 16  Ht 5\' 1"  (1.549 m)  Wt 156 lb (70.761 kg)  BMI 29.49 kg/m2  LMP 11/29/2012 Height: 5\' 1"  (154.9 cm)  Ht Readings from Last 3 Encounters:  05/06/14 5\' 1"  (1.549 m)  11/04/13 5\' 1"  (1.549 m)  09/04/13 5\' 1"  (1.549 m)    General appearance: alert, cooperative and appears stated age Head: Normocephalic, without obvious abnormality, atraumatic Neck: no adenopathy, supple, symmetrical, trachea midline and thyroid normal to inspection and palpation Lungs: clear to auscultation bilaterally Breasts: normal appearance, no masses or tenderness Heart: regular rate and rhythm Abdomen: soft, non-tender; no masses,  no organomegaly Extremities: extremities normal, atraumatic, no cyanosis or edema Skin: Skin color, texture, turgor normal. No rashes or lesions Lymph nodes: Cervical, supraclavicular, and axillary nodes normal. No abnormal inguinal nodes palpated Neurologic: Grossly normal  Pelvic: External genitalia:  no lesions              Urethra:  normal appearing urethra with no masses, tenderness or lesions              Bartholin's and Skene's: normal                 Vagina: normal appearing vagina with normal color and discharge, no lesions              Cervix: absent              Pap taken: No. Bimanual Exam:  Uterus:  uterus absent              Adnexa: no mass, fullness, tenderness               Rectovaginal: Confirms               Anus:  normal sphincter tone, no lesions  A:  Well Woman with normal exam  S/P R-TLH, Bilat Salp. Secondary to  fibroids 09/29/13  History of aneurism with surgery 03/26/11 w/o residual SE.  P:   Reviewed health and wellness pertinent to exam  Pap smear not taken today  Mammogram is due and info is given  Counseled on breast self exam, mammography screening, adequate intake of calcium and vitamin D, diet and exercise return annually or prn  An After Visit Summary was printed and given to the patient.

## 2014-05-06 NOTE — Progress Notes (Signed)
Encounter reviewed by Dr. Brook [name].  

## 2014-05-07 LAB — HEMOGLOBIN, FINGERSTICK: HEMOGLOBIN, FINGERSTICK: 13.4 g/dL (ref 12.0–16.0)

## 2014-09-08 ENCOUNTER — Encounter: Payer: Self-pay | Admitting: Nurse Practitioner

## 2014-11-06 ENCOUNTER — Ambulatory Visit (INDEPENDENT_AMBULATORY_CARE_PROVIDER_SITE_OTHER): Payer: 59 | Admitting: Physician Assistant

## 2014-11-06 VITALS — BP 114/78 | HR 81 | Temp 98.7°F | Resp 16 | Ht 61.0 in | Wt 139.0 lb

## 2014-11-06 DIAGNOSIS — J069 Acute upper respiratory infection, unspecified: Secondary | ICD-10-CM

## 2014-11-06 DIAGNOSIS — J029 Acute pharyngitis, unspecified: Secondary | ICD-10-CM

## 2014-11-06 LAB — POCT RAPID STREP A (OFFICE): RAPID STREP A SCREEN: NEGATIVE

## 2014-11-06 MED ORDER — LORATADINE 10 MG PO TABS: 10.0000 mg | ORAL_TABLET | Freq: Every day | ORAL | Status: DC

## 2014-11-06 MED ORDER — IBUPROFEN 800 MG PO TABS: ORAL_TABLET | ORAL | Status: DC

## 2014-11-06 MED ORDER — LORATADINE-PSEUDOEPHEDRINE ER 5-120 MG PO TB12: 1.0000 | ORAL_TABLET | Freq: Two times a day (BID) | ORAL | Status: AC

## 2014-11-06 NOTE — Progress Notes (Signed)
IDENTIFYING INFORMATION  Traci [name] / DOB: 1961-06-27 / MRN: [id number]  The patient  does not have a problem list on file.  SUBJECTIVE  CC: Sore Throat and Otalgia   HPI: Traci [name] is a 53 y.o. y.o. female presenting with sore throat that started 3 days ago. Her pain is moderate, and woke her up a 3 am. She is able to eat and drink without difficulty.  She has tried throat spray, cough drops, dayquil, nyquil all of which give her mild relief.  She denies nasal congestion, cough, fever, sweats, and chills.  She endorses ear stuffiness. She denies CP, nausea, SOB.    She  has a past medical history of Seizures (2011); UTI (lower urinary tract infection); Aneurysm (03/26/2011); S/P laparoscopic assisted vaginal hysterectomy (LAVH) (12/04/12); and Allergy.    She has a current medication list which includes the following prescription(s): levetiracetam, multivitamin, and voltaren.  Traci [name] is allergic to penicillins and poison ivy extract. She  reports that she has never smoked. She has never used smokeless tobacco. She reports that she drinks about 0.6 - 1.2 oz of alcohol per week. She reports that she does not use illicit drugs. She  reports that she does not currently engage in sexual activity.  The patient  has past surgical history that includes Cholecystectomy (03/2007); Cerebral aneurysm repair (5/11); Robotic assisted total hysterectomy (12/04/2012); and Cystoscopy (12/04/2012).  Her family history includes Cancer in her paternal grandfather; Heart failure in her maternal grandfather; Hypertension in her father; Osteoarthritis in her mother.  Review of Systems  Constitutional: Negative for fever and chills.  HENT: Positive for sore throat. Negative for congestion.   Cardiovascular: Negative for chest pain and palpitations.  Gastrointestinal: Negative.   Genitourinary: Negative.   Skin: Negative for itching and rash.  Neurological: Negative for headaches.    OBJECTIVE  Blood  pressure 114/78, pulse 81, temperature 98.7 F (37.1 C), temperature source Oral, resp. rate 16, height 5\' 1"  (1.549 m), weight 139 lb (63.05 kg), last menstrual period 11/29/2012, SpO2 99 %. The patient's body mass index is 26.28 kg/(m^2).  Physical Exam  Constitutional: She is oriented to person, place, and time. Vital signs are normal. She appears well-developed and well-nourished.  Non-toxic appearance.  HENT:  Right Ear: Hearing, tympanic membrane, external ear and ear canal normal.  Left Ear: Hearing, tympanic membrane, external ear and ear canal normal.  Nose: Nose normal.  Mouth/Throat: Uvula is midline and mucous membranes are normal. Posterior oropharyngeal edema present. No oropharyngeal exudate, posterior oropharyngeal erythema or tonsillar abscesses.  Cardiovascular: Normal rate, regular rhythm and normal heart sounds.   Respiratory: Effort normal and breath sounds normal.  GI: She exhibits no distension.  Musculoskeletal: Normal range of motion.  Lymphadenopathy:    She has cervical adenopathy.  Neurological: She is alert and oriented to person, place, and time. No cranial nerve deficit.  Skin: Skin is warm and dry.  Psychiatric: She has a normal mood and affect. Her behavior is normal. Judgment and thought content normal.    Results for orders placed or performed in visit on 11/06/14 (from the past 24 hour(s))  POCT rapid strep A     Status: None   Collection Time: 11/06/14  8:57 AM  Result Value Ref Range   Rapid Strep A Screen Negative Negative    ASSESSMENT & PLAN  Traci [name] was seen today for sore throat and otalgia.  Diagnoses and associated orders for this visit:  Sore throat - POCT  rapid strep A - Culture, Group A Strep - ibuprofen (ADVIL,MOTRIN) 800 MG tablet; Take 1 tab every eight hours for pain  Viral URI - loratadine-pseudoephedrine (CLARITIN-D 12 HOUR) 5-120 MG per tablet; Take 1 tablet by mouth 2 (two) times daily.     The patient was instructed  to to call or comeback to clinic as needed, or should symptoms warrant.  Philis Fendt, MHS, PA-C [facility] 11/06/2014 11:18 AM

## 2014-11-08 LAB — CULTURE, GROUP A STREP: ORGANISM ID, BACTERIA: NORMAL

## 2015-05-12 ENCOUNTER — Encounter: Payer: Self-pay | Admitting: Nurse Practitioner

## 2015-05-12 ENCOUNTER — Ambulatory Visit (INDEPENDENT_AMBULATORY_CARE_PROVIDER_SITE_OTHER): Payer: 59 | Admitting: Nurse Practitioner

## 2015-05-12 ENCOUNTER — Ambulatory Visit: Payer: 59 | Admitting: Nurse Practitioner

## 2015-05-12 VITALS — BP 110/68 | HR 84 | Resp 16 | Ht 61.0 in | Wt 135.0 lb

## 2015-05-12 DIAGNOSIS — Z9289 Personal history of other medical treatment: Secondary | ICD-10-CM | POA: Diagnosis not present

## 2015-05-12 DIAGNOSIS — I609 Nontraumatic subarachnoid hemorrhage, unspecified: Secondary | ICD-10-CM | POA: Diagnosis not present

## 2015-05-12 DIAGNOSIS — Z01419 Encounter for gynecological examination (general) (routine) without abnormal findings: Secondary | ICD-10-CM

## 2015-05-12 DIAGNOSIS — Z0000 Encounter for general adult medical examination without abnormal findings: Secondary | ICD-10-CM

## 2015-05-12 LAB — HEMOGLOBIN, FINGERSTICK: Hemoglobin, fingerstick: 12.9 g/dL (ref 12.0–16.0)

## 2015-05-12 LAB — POCT URINALYSIS DIPSTICK
Bilirubin, UA: NEGATIVE
Glucose, UA: NEGATIVE
KETONES UA: NEGATIVE
Leukocytes, UA: NEGATIVE
Nitrite, UA: NEGATIVE
PH UA: 7
PROTEIN UA: NEGATIVE
RBC UA: NEGATIVE
UROBILINOGEN UA: NEGATIVE

## 2015-05-12 NOTE — Progress Notes (Signed)
54 y.o. G2P2 Married  Caucasian Fe here for annual exam.  Feels well. Now weaned of  Kepra since 2 /2016 without seizure disorder.  Her recheck in June at Neurologist was good and hopeful that she would not have another seizure.  Patient's last menstrual period was 11/29/2012.          Sexually active: No.  The current method of family planning is status post hysterectomy.    Exercising: No.  The patient does not participate in regular exercise at present. Smoker:  no  Health Maintenance: Pap:  04/2012 Neg. HR HPV:Neg (colpo biopsy for lesion with HPV on colonoscopy - vaginal colpo was negative 08/2013. MMG:  05/11/12 BIRADS1:neg Colonoscopy:  06/2013 Normal repeat in 10 years TDaP:  2014 Labs: Here today UA: Negative Hg: 12.9   reports that she has never smoked. She has never used smokeless tobacco. She reports that she drinks about 0.6 - 1.2 oz of alcohol per week. She reports that she does not use illicit drugs.  Past Medical History  Diagnosis Date  . 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 UTI (lower urinary tract infection)     current with antibiotics  . 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 S/P laparoscopic assisted vaginal hysterectomy (LAVH) 12/04/12    Robotic and bilateral salpingectomy (ovaries remain)  . Allergy   . 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 Cholecystectomy  03/2007    lap chole  . 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 Maintained on Keppra-no seizure activity since surgery  . Robotic assisted total hysterectomy  12/04/2012    Procedure: ROBOTIC ASSISTED TOTAL HYSTERECTOMY;  Surgeon: Peri Maris, MD;  Location: [facility];  Service: Gynecology;  Laterality: Bilateral;  with bilateral salpingectomy  . Cystoscopy  12/04/2012    Procedure: CYSTOSCOPY;  Surgeon: Peri Maris, MD;   Location: [facility];  Service: Gynecology;  Laterality: N/A;    Current Outpatient Prescriptions  Medication Sig Dispense Refill  . ibuprofen (ADVIL,MOTRIN) 800 MG tablet Take 1 tab every eight hours for pain 30 tablet 0  . Multiple Vitamin (MULTIVITAMIN) tablet Take 1 tablet by mouth daily.     No current facility-administered medications for this visit.    Family History  Problem Relation Age of Onset  . Osteoarthritis Mother   . Hypertension Father   . Heart failure Maternal Grandfather   . Cancer Paternal Grandfather     colon cancer    ROS:  Pertinent items are noted in HPI.  Otherwise, a comprehensive ROS was negative.  Exam:   BP 110/68 mmHg  Pulse 84  Resp 16  Ht 5\' 1"  (1.549 m)  Wt 135 lb (61.236 kg)  BMI 25.52 kg/m2  LMP 11/29/2012 Height: 5\' 1"  (154.9 cm) Ht Readings from Last 3 Encounters:  05/12/15 5\' 1"  (1.549 m)  11/06/14 5\' 1"  (1.549 m)  05/06/14 5\' 1"  (1.549 m)    General appearance: alert, cooperative and appears stated age Head: Normocephalic, without obvious abnormality, atraumatic Neck: no adenopathy, supple, symmetrical, trachea midline and thyroid normal to inspection and palpation Lungs: clear to auscultation bilaterally Breasts: normal appearance, no masses or tenderness Heart: regular rate and rhythm Abdomen: soft, non-tender; no masses,  no organomegaly Extremities: extremities normal, atraumatic, no cyanosis or edema Skin: Skin color, texture, turgor  normal. No rashes or lesions Lymph nodes: Cervical, supraclavicular, and axillary nodes normal. No abnormal inguinal nodes palpated Neurologic: Grossly normal   Pelvic: External genitalia:  no lesions              Urethra:  normal appearing urethra with no masses, tenderness or lesions              Bartholin's and Skene's: normal                 Vagina: normal appearing vagina with normal color and discharge, no lesions              Cervix: absent              Pap taken: Yes.   Bimanual  Exam:  Uterus:  uterus absent              Adnexa: no mass, fullness, tenderness               Rectovaginal: Confirms               Anus:  normal sphincter tone, no lesions  Chaperone present: Yes  A:  Well Woman with normal exam  S/P R-TLH, Bilateral Salpingectomy. secondary to fibroids 09/29/13 History of aneurism with surgery 03/26/11 w/o residual SE.  History of HPV on lesion during colonoscopy with vaginal/ vulvar colpo 09/04/13 without biopsy and no rectal lesions.  She had pap 2013 with negative HPV  P:   Reviewed health and wellness pertinent to exam  Pap smear as above  Mammogram is past due from 2013 - will make apt. for pt.  Will follow with labs  Counseled on breast self exam, mammography screening, adequate intake of calcium and vitamin D, diet and exercise return annually or prn  An After Visit Summary was printed and given to the patient.

## 2015-05-12 NOTE — Progress Notes (Signed)
Scheduled patient while in office for screening mammogram at the [facility]. Appointment scheduled for 05/22/2015 at 1:40pm. Patient is agreeable to date and time.

## 2015-05-12 NOTE — Patient Instructions (Signed)

## 2015-05-13 LAB — COMPREHENSIVE METABOLIC PANEL
ALK PHOS: 72 U/L (ref 39–117)
ALT: 9 U/L (ref 0–35)
AST: 7 U/L (ref 0–37)
Albumin: 3.8 g/dL (ref 3.5–5.2)
BILIRUBIN TOTAL: 0.5 mg/dL (ref 0.2–1.2)
BUN: 12 mg/dL (ref 6–23)
CO2: 31 mEq/L (ref 19–32)
Calcium: 8.7 mg/dL (ref 8.4–10.5)
Chloride: 106 mEq/L (ref 96–112)
Creat: 0.55 mg/dL (ref 0.50–1.10)
Glucose, Bld: 76 mg/dL (ref 70–99)
Potassium: 4.8 mEq/L (ref 3.5–5.3)
SODIUM: 147 meq/L — AB (ref 135–145)
TOTAL PROTEIN: 6.5 g/dL (ref 6.0–8.3)

## 2015-05-13 LAB — TSH: TSH: 3.034 u[IU]/mL (ref 0.350–4.500)

## 2015-05-13 LAB — LIPID PANEL
CHOL/HDL RATIO: 1.9 ratio
Cholesterol: 156 mg/dL (ref 0–200)
HDL: 83 mg/dL (ref >=46)
LDL CALC: 64 mg/dL (ref 0–99)
TRIGLYCERIDES: 44 mg/dL (ref <150)
VLDL: 9 mg/dL (ref 0–40)

## 2015-05-13 LAB — VITAMIN D 25 HYDROXY (VIT D DEFICIENCY, FRACTURES): Vit D, 25-Hydroxy: 25 ng/mL — ABNORMAL LOW (ref 30–100)

## 2015-05-14 LAB — IPS PAP TEST WITH HPV

## 2015-05-15 ENCOUNTER — Telehealth: Payer: Self-pay | Admitting: Nurse Practitioner

## 2015-05-15 NOTE — Telephone Encounter (Signed)
Pt is called to review that her vaginal pap was normal.  After discussion with Dr. Quincy Simmonds we also need to do an anal pap. She had HPV on a colonoscopy and a follow up vaginal and vulvar colposcopy was negative 08/2013.  She declined anal colpo. Pt. is informed and is scheduled for 05/20/15. pap08

## 2015-05-18 NOTE — Progress Notes (Signed)
Encounter reviewed by Dr. Aundria Rud. Patient will return for anal Pap smear and HR HPV testing.

## 2015-05-20 ENCOUNTER — Encounter: Payer: Self-pay | Admitting: Nurse Practitioner

## 2015-05-20 ENCOUNTER — Ambulatory Visit (INDEPENDENT_AMBULATORY_CARE_PROVIDER_SITE_OTHER): Payer: 59 | Admitting: Nurse Practitioner

## 2015-05-20 ENCOUNTER — Other Ambulatory Visit: Payer: Self-pay

## 2015-05-20 VITALS — BP 118/76 | HR 64 | Ht 61.0 in | Wt 134.0 lb

## 2015-05-20 DIAGNOSIS — K6289 Other specified diseases of anus and rectum: Secondary | ICD-10-CM | POA: Diagnosis not present

## 2015-05-20 DIAGNOSIS — B977 Papillomavirus as the cause of diseases classified elsewhere: Secondary | ICD-10-CM | POA: Diagnosis not present

## 2015-05-20 DIAGNOSIS — A630 Anogenital (venereal) warts: Secondary | ICD-10-CM

## 2015-05-20 NOTE — Progress Notes (Signed)
53 y.o. WM female [id number] here with history of a colonoscopy that was done on 06/26/2013.  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   She had a vaginal/ vulvar colposcopy 09/04/2014  without any abnormal findings.  She then returned for AEX on 05/12/15 and vaginal pap was negative including HPV.   She returns now for a anal pap to follow up for the anal findings.    We have discussed and reviewed pathology from the colonoscopy and our recommendations are to do a vaginal and rectal pap yearly.     O: Healthy WD,WN female Affect: slight anxious Skin: warm and dry Abdomen:  Soft and non tender Anal exam: pt is placed in the left lateral position in knee chest position. No anal rectal lesions are noted.  The swab was moistened as directed and 2 flocked swabs were used to collect the specimen from the anal area up to the sphincter muscle.  A:   History of normal pap with negative HR HPV prior to pap 2014  S/P Robotic Hysterectomy secondary to fibroids 12/04/12  S/P Colonoscopy with rectal polyp showing HPV effect without high grade lesion 06/2013  S/P Colpo vaginal and vulva without biopsy and negative for lesions 08/2013    P:  Discussed findings of HPV and future recommendations for follow up.  Will follow with pap and discuss findings   Labs :  Pap anal is sent to [facility]    RV

## 2015-05-20 NOTE — Progress Notes (Signed)
I requested courier with Shirlean Mylar at [facility].

## 2015-05-21 ENCOUNTER — Telehealth: Payer: Self-pay

## 2015-05-21 NOTE — Telephone Encounter (Signed)
Patient will needed to be notified and scheduled for repeat pap, we need make sure that we have appropriate swab to  Collect.

## 2015-05-21 NOTE — Telephone Encounter (Signed)
Spoke with Dr.Manny from [facility] pathology. Dr.Manny states that pap sent on 05/20/2015 was "unsatisfactory due to not having enough cells." States report will be generated and sent to Milford Cage, FNP but since this was a rushed order she wanted to let her know. Advised will let Milford Cage, FNP and covering provider know. Dr.Manny is agreeable.  Cc: Milford Cage, FNP   Regina Eck CNM this patient had a rectal pap on 05/20/2015. Please review and advise next steps.

## 2015-05-21 NOTE — Telephone Encounter (Signed)
Left message to call Kiron Osmun at [phone]. 

## 2015-05-22 ENCOUNTER — Ambulatory Visit
Admission: RE | Admit: 2015-05-22 | Discharge: 2015-05-22 | Disposition: A | Discharge status: Home / Self Care | Payer: 59 | Source: Ambulatory Visit | Attending: Nurse Practitioner | Admitting: Nurse Practitioner

## 2015-05-22 DIAGNOSIS — Z9289 Personal history of other medical treatment: Secondary | ICD-10-CM

## 2015-05-22 NOTE — Progress Notes (Signed)
Encounter reviewed by Dr. Aundria Rud. Colposcopy of vulva and vagina carried down to the anus - done 08/30/13 by Dr. Elveria Rising. No lesions seen.  No biopsy performed.

## 2015-05-22 NOTE — Telephone Encounter (Signed)
Spoke with patient. Advised of message as seen below from [place]. Patient is agreeable and verbalizes understanding. Appointment scheduled for 05/26/2014 at 11:15am with Milford Cage, Martin. Agreeable to date and time.  Routing to provider for final review. Patient agreeable to disposition. Will close encounter.   Patient aware provider will review message and nurse will return call if any additional advice or change of disposition.

## 2015-05-27 ENCOUNTER — Ambulatory Visit (INDEPENDENT_AMBULATORY_CARE_PROVIDER_SITE_OTHER): Payer: 59 | Admitting: Nurse Practitioner

## 2015-05-27 ENCOUNTER — Encounter: Payer: Self-pay | Admitting: Nurse Practitioner

## 2015-05-27 DIAGNOSIS — B977 Papillomavirus as the cause of diseases classified elsewhere: Secondary | ICD-10-CM

## 2015-05-27 DIAGNOSIS — A630 Anogenital (venereal) warts: Secondary | ICD-10-CM

## 2015-05-27 NOTE — Progress Notes (Signed)
54 y.o. MW  female [id number] here for follow up of an ansl pap done last week that showed insufficient cells - most likley due to the type of collection swabs.    History of a colonoscopy that was done on 06/26/2013. 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   O: Healthy WD,WN female Affect: normal Skin: warm and dry Abdomen: not examined Anal exam:  cyto brush is used this time with a sweep around the rectum and another cytobrush just at the anal sphinctor  A: R/O HPV at the anus  History of normal pap with negative HR HPV prior to pap 2014  S/P Robotic Hysterectomy secondary to fibroids 12/04/12 S/P Colonoscopy with rectal polyp showing HPV effect without high grade lesion 06/2013 S/P Colpo vaginal and vulva without biopsy and negative for lesions 08/2013   P:  Will follow with pap of the anus   Labs : pap of the anus   RV She  Is not charged another OV today.

## 2015-06-03 NOTE — Progress Notes (Signed)
Encounter reviewed by Dr. Trevon Strothers Amundson C. [name].  

## 2015-06-25 ENCOUNTER — Telehealth: Payer: Self-pay

## 2015-06-25 DIAGNOSIS — IMO0002 Reserved for concepts with insufficient information to code with codable children: Secondary | ICD-10-CM

## 2015-06-25 NOTE — Telephone Encounter (Signed)
Traci Oletta Lamas, MD  Kem Boroughs, FNP Cc: P [facility] Triage Pool; Huey Romans, RN           I am recommending that the patient be seen for consultation at Dr. Lorie Apley office to receive her recommendations and evaluation directly.   I have not heard back from her to date.   I will send this message also through to Triage to facilitate.   Thanks.   Josefa Half   Cc- Triage and Lamont Snowball

## 2015-06-25 NOTE — Telephone Encounter (Signed)
Spoke with patient. Advised of message as seen below from [place]. Patient is agreeable and verbalizes understanding. Referral placed to Dr.Mann's office. Advised will call to schedule appointment and return call with appointment date and time. Patient is agreeable. Call to Dr.Mann's office phones are off for lunch until 1:30pm. Will return call after 1:30pm to obtain appointment date and time.

## 2015-06-25 NOTE — Telephone Encounter (Signed)
Left message for Traci [name] at [facility] office to return call to schedule patient appointment.

## 2015-06-25 NOTE — Telephone Encounter (Signed)
Spoke with Traci [name] from Dr.Mann's office. Appointment scheduled for 8/25 at 9am with Dr.Mann. Spoke with patient who is agreeable to appointment date and time.  GA:[id number] Rolen-Grubb, FNP   Routing to provider for final review. Patient agreeable to disposition. Will close encounter.   Patient aware provider will review message and nurse will return call if any additional advice or change of disposition.

## 2015-09-23 ENCOUNTER — Encounter: Payer: Self-pay | Admitting: Family Medicine

## 2015-09-23 ENCOUNTER — Ambulatory Visit (INDEPENDENT_AMBULATORY_CARE_PROVIDER_SITE_OTHER): Payer: 59 | Admitting: Family Medicine

## 2015-09-23 VITALS — BP 118/68 | HR 95 | Temp 98.6°F | Resp 16 | Ht 61.0 in | Wt 141.0 lb

## 2015-09-23 DIAGNOSIS — Z23 Encounter for immunization: Secondary | ICD-10-CM | POA: Diagnosis not present

## 2015-09-23 DIAGNOSIS — L309 Dermatitis, unspecified: Secondary | ICD-10-CM

## 2015-09-23 DIAGNOSIS — Z7185 Encounter for immunization safety counseling: Secondary | ICD-10-CM

## 2015-09-23 DIAGNOSIS — Z7189 Other specified counseling: Secondary | ICD-10-CM

## 2015-09-23 MED ORDER — TRIAMCINOLONE ACETONIDE 0.1 % EX CREA: 1.0000 "application " | TOPICAL_CREAM | Freq: Two times a day (BID) | CUTANEOUS | Status: DC

## 2015-09-23 NOTE — Patient Instructions (Signed)
Eczema Eczema, also called atopic dermatitis, is a skin disorder that causes inflammation of the skin. It causes a red rash and dry, scaly skin. The skin becomes very itchy. Eczema is generally worse during the cooler winter months and often improves with the warmth of summer. Eczema usually starts showing signs in infancy. Some children outgrow eczema, but it may last through adulthood.  CAUSES  The exact cause of eczema is not known, but it appears to run in families. People with eczema often have a family history of eczema, allergies, asthma, or hay fever. Eczema is not contagious. Flare-ups of the condition may be caused by:   Contact with something you are sensitive or allergic to.   Stress. SIGNS AND SYMPTOMS  Dry, scaly skin.   Red, itchy rash.   Itchiness. This may occur before the skin rash and may be very intense.  DIAGNOSIS  The diagnosis of eczema is usually made based on symptoms and medical history. TREATMENT  Eczema cannot be cured, but symptoms usually can be controlled with treatment and other strategies. A treatment plan might include:  Controlling the itching and scratching.   Use over-the-counter antihistamines as directed for itching. This is especially useful at night when the itching tends to be worse.   Use over-the-counter steroid creams as directed for itching.   Avoid scratching. Scratching makes the rash and itching worse. It may also result in a skin infection (impetigo) due to a break in the skin caused by scratching.   Keeping the skin well moisturized with creams every day. This will seal in moisture and help prevent dryness. Lotions that contain alcohol and water should be avoided because they can dry the skin.   Limiting exposure to things that you are sensitive or allergic to (allergens).   Recognizing situations that cause stress.   Developing a plan to manage stress.  HOME CARE INSTRUCTIONS   Only take over-the-counter or  prescription medicines as directed by your health care provider.   Do not use anything on the skin without checking with your health care provider.   Keep baths or showers short (5 minutes) in warm (not hot) water. Use mild cleansers for bathing. These should be unscented. You may add nonperfumed bath oil to the bath water. It is best to avoid soap and bubble bath.   Immediately after a bath or shower, when the skin is still damp, apply a moisturizing ointment to the entire body. This ointment should be a petroleum ointment. This will seal in moisture and help prevent dryness. The thicker the ointment, the better. These should be unscented.   Keep fingernails cut short. Children with eczema may need to wear soft gloves or mittens at night after applying an ointment.   Dress in clothes made of cotton or cotton blends. Dress lightly, because heat increases itching.   A child with eczema should stay away from anyone with fever blisters or cold sores. The virus that causes fever blisters (herpes simplex) can cause a serious skin infection in children with eczema. SEEK MEDICAL CARE IF:   Your itching interferes with sleep.   Your rash gets worse or is not better within 1 week after starting treatment.   You see pus or soft yellow scabs in the rash area.   You have a fever.   You have a rash flare-up after contact with someone who has fever blisters.    This information is not intended to replace advice given to you by your health care   provider. Make sure you discuss any questions you have with your health care provider.   Document Released: 10/21/2000 Document Revised: 08/14/2013 Document Reviewed: 05/27/2013 Elsevier Interactive Patient Education 2016 [employer]  

## 2015-09-23 NOTE — Progress Notes (Signed)
@UMFCLOGO @  This chart was scribed for Robyn Haber, MD by Thea Alken, ED Scribe. This patient was seen in room 3 and the patient's care was started at 6:35 PM.  Patient ID: Traci [name] MRN: [id number], DOB: December 16, 1960, 54 y.o. Date of Encounter: 09/23/2015, 6:25 PM  Primary Physician: No PCP Per Patient  Chief Complaint:  Chief Complaint  Patient presents with  . Rash    both legs and hands, x months     HPI: 54 y.o. year old female with history below presents with an itchy rash to bilateral calf for a couple of months. Rash is itchy and irritating and will often have water blisters and ooze. Pt states rash will get better over time but then return. In the summer rash is not as bad but in the winter rash will worsen and appear on her hands. She has tried OTC creams but this has been ineffective. Pt works at [employer].    Past Medical History  Diagnosis Date  . 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 UTI (lower urinary tract infection)     current with antibiotics  . 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 S/P laparoscopic assisted vaginal hysterectomy (LAVH) 12/04/12    Robotic and bilateral salpingectomy (ovaries remain)  . Allergy   . 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 Authorizing Provider  Multiple Vitamin (MULTIVITAMIN) tablet Take 1 tablet by mouth daily.    Historical Provider, MD    Allergies:  Allergies  Allergen Reactions  . Penicillins     childhood  . Poison Ivy Extract [employer] Poison Ivy] Hives and Rash    Social History   Social History  . Marital Status: Married    Spouse Name: Legrand Como  . Number of Children: N/A  . Years of Education: N/A   Occupational History  . Book Seller @ [employer]    Social History Main Topics  . Smoking status:  Never Smoker   . Smokeless tobacco: Never Used  . Alcohol Use: 0.6 - 1.2 oz/week    1-2 Glasses of wine per week     Comment: occas  . Drug Use: No  . Sexual Activity: Not Currently    Birth Control/ Protection: Abstinence, Surgical, Post-menopausal   Other Topics Concern  . Not on file   Social History Narrative     Review of Systems: Constitutional: negative for chills, fever, night sweats, weight changes, or fatigue  HEENT: negative for vision changes, hearing loss, congestion, rhinorrhea, ST, epistaxis, or sinus pressure Cardiovascular: negative for chest pain or palpitations Respiratory: negative for hemoptysis, wheezing, shortness of breath, or cough Abdominal: negative for abdominal pain, nausea, vomiting, diarrhea, or constipation Dermatological: Positive for rash Neurologic: negative for headache, dizziness, or syncope All other systems reviewed and are otherwise negative with the exception to those above and in the HPI.   Physical Exam: Last menstrual period 11/29/2012., There is no weight on file to calculate BMI. General: Well developed, well nourished, in no acute distress. Head: Normocephalic, atraumatic, eyes without discharge, sclera non-icteric, nares are without discharge. Bilateral auditory canals clear, TM's are without perforation, pearly grey and translucent with reflective cone of light bilaterally. Oral cavity moist, posterior pharynx without exudate, erythema, peritonsillar abscess, or post nasal drip.  Neck:  Supple. No thyromegaly. Full ROM. No lymphadenopathy. Lungs: Clear bilaterally to auscultation without wheezes, rales, or rhonchi. Breathing is unlabored. Heart: RRR with S1 S2. No murmurs, rubs, or gallops appreciated. Abdomen: Soft, non-tender, non-distended with normoactive bowel sounds. No hepatomegaly. No rebound/guarding. No obvious abdominal masses. Msk:  Strength and tone normal for age. Extremities/Skin: Warm and dry. No clubbing or cyanosis.  No edema.  Dry scaly rash on bilateral calves symmetrically.  fissuring over MCP joint with redness.  Neuro: Alert and oriented X 3. Moves all extremities spontaneously. Gait is normal. CNII-XII grossly in tact. Psych:  Responds to questions appropriately with a normal affect.    ASSESSMENT AND PLAN:  54 y.o. year old female with     ICD-9-CM ICD-10-CM   1. Eczema 692.9 L30.9 triamcinolone cream (KENALOG) 0.1 %  2. Immunization counseling V65.49 Z71.89 Flu Vaccine QUAD 36+ mos IM   This chart was scribed in my presence and reviewed by me personally.   By signing my name below, I, Raven Small, attest that this documentation has been prepared under the direction and in the presence of Robyn Haber, MD.  Electronically Signed: Thea Alken, ED Scribe. 09/23/2015. 6:47 PM.  Signed, Robyn Haber, MD 09/23/2015 6:25 PM

## 2016-05-13 ENCOUNTER — Ambulatory Visit (INDEPENDENT_AMBULATORY_CARE_PROVIDER_SITE_OTHER): Payer: 59 | Admitting: Nurse Practitioner

## 2016-05-13 ENCOUNTER — Encounter: Payer: Self-pay | Admitting: Nurse Practitioner

## 2016-05-13 VITALS — BP 104/86 | HR 76 | Resp 18 | Ht 60.5 in | Wt 148.0 lb

## 2016-05-13 DIAGNOSIS — Z01419 Encounter for gynecological examination (general) (routine) without abnormal findings: Secondary | ICD-10-CM | POA: Diagnosis not present

## 2016-05-13 DIAGNOSIS — Z0000 Encounter for general adult medical examination without abnormal findings: Secondary | ICD-10-CM

## 2016-05-13 NOTE — Progress Notes (Signed)
55 y.o. G63P2002 Married  Caucasian Fe here for annual exam.  She has history of HPV on colonoscopy 2014.  We did anal pap and then sent her back to see Dr. Collene Mares last year.  Pt did see Dr. Collene Mares last 06/2015 and was told no further eval was needed.  Will get copy of notes.  Patient's last menstrual period was 11/29/2012 (exact date).          Sexually active: No.  The current method of family planning is abstinence and post menopausal status.    Exercising: No.  The patient does not participate in regular exercise at present. Smoker:  no  Health Maintenance: Pap:  05/12/15 Neg. HR HPV:neg MMG:  05/22/15 BIRADS1:neg Colonoscopy:  06/26/13 Polyps - f/u 5-10 years  TDaP:  11/04/13  Hep C and HIV: done today Labs: done today   reports that she has never smoked. She has never used smokeless tobacco. She reports that she drinks about 0.6 - 1.2 oz of alcohol per week. She reports that she does not use illicit drugs.  Past Medical History  Diagnosis Date  . Seizures ([place]) 2011    post cerebral repair  . UTI (lower urinary tract infection)     current with antibiotics  . Aneurysm ([place]) 03/26/2011  . S/P laparoscopic assisted vaginal hysterectomy (LAVH) 12/04/12    Robotic and bilateral salpingectomy (ovaries remain)  . Allergy   . 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 Cholecystectomy  03/2007    lap chole  . 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 Maintained on Keppra-no seizure activity since surgery  . Robotic assisted total hysterectomy  12/04/2012    Procedure: ROBOTIC ASSISTED TOTAL HYSTERECTOMY;  Surgeon: Peri Maris, MD;  Location: [facility];  Service: Gynecology;  Laterality: Bilateral;  with bilateral salpingectomy  . Cystoscopy  12/04/2012    Procedure: CYSTOSCOPY;  Surgeon: Peri Maris,  MD;  Location: [facility];  Service: Gynecology;  Laterality: N/A;    Current Outpatient Prescriptions  Medication Sig Dispense Refill  . loratadine (CLARITIN) 10 MG tablet Take 10 mg by mouth daily.    . Multiple Vitamin (MULTIVITAMIN) tablet Take 1 tablet by mouth daily.    Marland Kitchen triamcinolone cream (KENALOG) 0.1 % Apply 1 application topically 2 (two) times daily. 454 g 11   No current facility-administered medications for this visit.    Family History  Problem Relation Age of Onset  . Osteoarthritis Mother   . Hypertension Father   . Heart failure Maternal Grandfather   . Cancer Paternal Grandfather     colon cancer    ROS:  Pertinent items are noted in HPI.  Otherwise, a comprehensive ROS was negative.  Exam:   BP 104/86 mmHg  Pulse 76  Resp 18  Ht 5' 0.5" (1.537 m)  Wt 148 lb (67.132 kg)  BMI 28.42 kg/m2  LMP 11/29/2012 (Exact Date) Height: 5' 0.5" (153.7 cm) Ht Readings from Last 3 Encounters:  05/13/16 5' 0.5" (1.537 m)  09/23/15 5\' 1"  (1.549 m)  05/20/15 5\' 1"  (1.549 m)    General appearance: alert, cooperative and appears stated age Head: Normocephalic, without obvious abnormality, atraumatic Neck: no adenopathy, supple, symmetrical, trachea midline and thyroid normal to inspection and palpation Lungs: clear to auscultation bilaterally Breasts: normal appearance, no masses  or tenderness Heart: regular rate and rhythm Abdomen: soft, non-tender; no masses,  no organomegaly Extremities: extremities normal, atraumatic, no cyanosis or edema Skin: Skin color, texture, turgor normal. No rashes or lesions Lymph nodes: Cervical, supraclavicular, and axillary nodes normal. No abnormal inguinal nodes palpated Neurologic: Grossly normal   Pelvic: External genitalia:  no lesions              Urethra:  normal appearing urethra with no masses, tenderness or lesions              Bartholin's and Skene's: normal                 Vagina: normal appearing vagina with normal color  and discharge, no lesions              Cervix: absent              Pap taken: No. Bimanual Exam:  Uterus:  uterus absent              Adnexa: no mass, fullness, tenderness               Rectovaginal: Confirms               Anus:  normal sphincter tone, no lesions  Chaperone present: no  A:  Well Woman with normal exam  S/P R-TLH, Bilateral Salpingectomy. secondary to fibroids 09/29/13 History of aneurism with surgery 03/26/11 w/o residual SE. History of HPV on lesion during colonoscopy with vaginal/ vulvar colpo 09/04/13 without biopsy and no rectal lesions. She had pap 2013 with negative HPV   P:   Reviewed health and wellness pertinent to exam  Pap smear of vaginal not indicated.  She had vaginal colpo and was negative  Mammogram is due and will schedule  Follow with labs  Counseled on breast self exam, mammography screening, adequate intake of calcium and vitamin D, diet and exercise, Kegel's exercises return annually or prn  An After Visit Summary was printed and given to the patient.  ROI for notes from Dr. Lorie Apley last apt.

## 2016-05-13 NOTE — Patient Instructions (Signed)

## 2016-05-14 LAB — COMPREHENSIVE METABOLIC PANEL
ALK PHOS: 75 U/L (ref 33–130)
ALT: 7 U/L (ref 6–29)
AST: 7 U/L — AB (ref 10–35)
Albumin: 3.9 g/dL (ref 3.6–5.1)
BILIRUBIN TOTAL: 0.5 mg/dL (ref 0.2–1.2)
BUN: 14 mg/dL (ref 7–25)
CO2: 26 mmol/L (ref 20–31)
Calcium: 8.7 mg/dL (ref 8.6–10.4)
Chloride: 106 mmol/L (ref 98–110)
Creat: 0.58 mg/dL (ref 0.50–1.05)
GLUCOSE: 80 mg/dL (ref 65–99)
Potassium: 4.6 mmol/L (ref 3.5–5.3)
Sodium: 141 mmol/L (ref 135–146)
Total Protein: 6.7 g/dL (ref 6.1–8.1)

## 2016-05-14 LAB — HEPATITIS C ANTIBODY: HCV Ab: NEGATIVE

## 2016-05-14 LAB — HIV ANTIBODY (ROUTINE TESTING W REFLEX): HIV: NONREACTIVE

## 2016-05-14 LAB — VITAMIN D 25 HYDROXY (VIT D DEFICIENCY, FRACTURES): Vit D, 25-Hydroxy: 21 ng/mL — ABNORMAL LOW (ref 30–100)

## 2016-05-15 NOTE — Progress Notes (Signed)
Encounter reviewed by Dr. Haneefah Venturini Amundson C. [name].  

## 2017-05-05 ENCOUNTER — Telehealth: Payer: Self-pay | Admitting: Nurse Practitioner

## 2017-05-05 NOTE — Telephone Encounter (Signed)
Left patient a message to call back to reschedule a future appointment that was cancelled by the provider. °

## 2017-05-17 ENCOUNTER — Ambulatory Visit: Payer: 59 | Admitting: Nurse Practitioner

## 2017-05-19 ENCOUNTER — Encounter: Payer: Self-pay | Admitting: Certified Nurse Midwife

## 2017-05-19 ENCOUNTER — Ambulatory Visit (INDEPENDENT_AMBULATORY_CARE_PROVIDER_SITE_OTHER): Payer: 59 | Admitting: Certified Nurse Midwife

## 2017-05-19 VITALS — BP 102/68 | HR 68 | Resp 16 | Ht 60.5 in | Wt 156.0 lb

## 2017-05-19 DIAGNOSIS — Z0000 Encounter for general adult medical examination without abnormal findings: Secondary | ICD-10-CM

## 2017-05-19 DIAGNOSIS — Z01419 Encounter for gynecological examination (general) (routine) without abnormal findings: Secondary | ICD-10-CM

## 2017-05-19 NOTE — Patient Instructions (Signed)

## 2017-05-19 NOTE — Progress Notes (Signed)
56 y.o. G25P2002 Married  Caucasian Fe here for annual exam. Menopausal no HRT. Denies vaginal dryness. Shocked about Patty Grubb,FNP. Not currently sexually active due spouse working out of state and not a concern for them anymore. Sees Neurology for medication management for Epilepsy type seizures. No health issues today. Screening labs if needed. Sees Urgent care if needed.  Patient's last menstrual period was 11/29/2012 (exact date).          Sexually active: No.  The current method of family planning is hysterectomy  Exercising: No.  exercise Smoker:  no  Health Maintenance: Pap:  05-12-15 neg HPV HR neg History of Abnormal Pap: no MMG:  05-22-15 category c density birads 1:neg Self Breast exams: no Colonoscopy:  2016 BMD:   none TDaP:  2014 Shingles: no  Pneumonia: no Hep C and HIV: both neg 2017 Labs: none   reports that she has never smoked. She has never used smokeless tobacco. She reports that she drinks about 0.6 - 1.2 oz of alcohol per week . She reports that she does not use drugs.  Past Medical History:  Diagnosis Date  . 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 Allergy   . Aneurysm ([place]) 03/26/2011  . S/P laparoscopic assisted vaginal hysterectomy (LAVH) 12/04/12   Robotic and bilateral salpingectomy (ovaries remain)  . Seizures ([place]) 2011   post cerebral repair  . UTI (lower urinary tract infection)    current with antibiotics    Past Surgical History:  Procedure Laterality Date  . CEREBRAL ANEURYSM REPAIR  5/11   [facility]-released from Neurologist. Maintained on Keppra-no seizure activity since surgery  . CHOLECYSTECTOMY  03/2007   lap chole  . CYSTOSCOPY  12/04/2012   Procedure: CYSTOSCOPY;  Surgeon: Peri Maris, MD;  Location: [facility];  Service: Gynecology;  Laterality: N/A;  . ROBOTIC ASSISTED TOTAL HYSTERECTOMY  12/04/2012   Procedure: ROBOTIC  ASSISTED TOTAL HYSTERECTOMY;  Surgeon: Peri Maris, MD;  Location: [facility];  Service: Gynecology;  Laterality: Bilateral;  with bilateral salpingectomy    Current Outpatient Prescriptions  Medication Sig Dispense Refill  . loratadine (CLARITIN) 10 MG tablet Take 10 mg by mouth daily.    . Multiple Vitamin (MULTIVITAMIN) tablet Take 1 tablet by mouth daily.    Marland Kitchen triamcinolone cream (KENALOG) 0.1 % Apply 1 application topically 2 (two) times daily. 454 g 11   No current facility-administered medications for this visit.     Family History  Problem Relation Age of Onset  . Osteoarthritis Mother   . Hypertension Father   . Heart failure Maternal Grandfather   . Cancer Paternal Grandfather        colon cancer    ROS:  Pertinent items are noted in HPI.  Otherwise, a comprehensive ROS was negative.  Exam:   LMP 11/29/2012 (Exact Date)    Ht Readings from Last 3 Encounters:  05/13/16 5' 0.5" (1.537 m)  09/23/15 5\' 1"  (1.549 m)  05/20/15 5\' 1"  (1.549 m)    General appearance: alert, cooperative and appears stated age Head: Normocephalic, without obvious abnormality, atraumatic Neck: no adenopathy, supple, symmetrical, trachea midline and thyroid normal to inspection and palpation Lungs: clear to auscultation bilaterally Breasts: normal appearance, no masses or tenderness, No nipple retraction or dimpling, No nipple discharge or bleeding, No axillary or supraclavicular adenopathy Heart: regular rate and rhythm Abdomen: soft, non-tender; no masses,  no  organomegaly Extremities: extremities normal, atraumatic, no cyanosis or edema Skin: Skin color, texture, turgor normal. No rashes or lesions Lymph nodes: Cervical, supraclavicular, and axillary nodes normal. No abnormal inguinal nodes palpated Neurologic: Grossly normal   Pelvic: External genitalia:  no lesions              Urethra:  normal appearing urethra with no masses, tenderness or lesions              Bartholin's and  Skene's: normal                 Vagina: normal appearing vagina with normal color and discharge, no lesions              Cervix: absent              Pap taken: No. Bimanual Exam:  Uterus:  uterus absent              Adnexa: normal adnexa and no mass, fullness, tenderness               Rectovaginal: Confirms               Anus:  normal sphincter tone, no lesions  Chaperone present: yes  A:  Well Woman with normal exam  Menopausal no HRT S/P TAH for bleeding ovaries retained  History of epilepsy type seizures with Neurology management  Screening labs  P:   Reviewed health and wellness pertinent to exam  Aware of need to evaluate if vaginal bleeding or dryness issues  Continue follow up with MD as indicated  Labs: CBC, CMP, Lipid panel, TSH,VitaminD  Pap smear: no   counseled on breast self exam, mammography screening, adequate intake of calcium and vitamin D, diet and exercise  return annually or prn  An After Visit Summary was printed and given to the patient.

## 2017-05-20 ENCOUNTER — Other Ambulatory Visit: Payer: Self-pay | Admitting: Certified Nurse Midwife

## 2017-05-20 DIAGNOSIS — E559 Vitamin D deficiency, unspecified: Secondary | ICD-10-CM

## 2017-05-20 DIAGNOSIS — R899 Unspecified abnormal finding in specimens from other organs, systems and tissues: Secondary | ICD-10-CM

## 2017-05-20 LAB — COMPREHENSIVE METABOLIC PANEL
ALBUMIN: 4 g/dL (ref 3.5–5.5)
ALT: 8 IU/L (ref 0–32)
AST: 7 IU/L (ref 0–40)
Albumin/Globulin Ratio: 1.3 (ref 1.2–2.2)
Alkaline Phosphatase: 85 IU/L (ref 39–117)
BUN/Creatinine Ratio: 25 — ABNORMAL HIGH (ref 9–23)
BUN: 16 mg/dL (ref 6–24)
Bilirubin Total: 0.3 mg/dL (ref 0.0–1.2)
CALCIUM: 9.4 mg/dL (ref 8.7–10.2)
CO2: 27 mmol/L (ref 20–29)
Chloride: 107 mmol/L — ABNORMAL HIGH (ref 96–106)
Creatinine, Ser: 0.63 mg/dL (ref 0.57–1.00)
GFR calc Af Amer: 115 mL/min/{1.73_m2} (ref >59)
GFR, EST NON AFRICAN AMERICAN: 100 mL/min/{1.73_m2} (ref >59)
GLOBULIN, TOTAL: 3 g/dL (ref 1.5–4.5)
GLUCOSE: 88 mg/dL (ref 65–99)
Potassium: 4.6 mmol/L (ref 3.5–5.2)
SODIUM: 147 mmol/L — AB (ref 134–144)
Total Protein: 7 g/dL (ref 6.0–8.5)

## 2017-05-20 LAB — LIPID PANEL
Chol/HDL Ratio: 2.2 ratio (ref 0.0–4.4)
Cholesterol, Total: 187 mg/dL (ref 100–199)
HDL: 86 mg/dL (ref >39)
LDL Calculated: 89 mg/dL (ref 0–99)
TRIGLYCERIDES: 62 mg/dL (ref 0–149)
VLDL Cholesterol Cal: 12 mg/dL (ref 5–40)

## 2017-05-20 LAB — CBC
HEMATOCRIT: 37.4 % (ref 34.0–46.6)
HEMOGLOBIN: 12.4 g/dL (ref 11.1–15.9)
MCH: 28.4 pg (ref 26.6–33.0)
MCHC: 33.2 g/dL (ref 31.5–35.7)
MCV: 86 fL (ref 79–97)
Platelets: 244 10*3/uL (ref 150–379)
RBC: 4.37 x10E6/uL (ref 3.77–5.28)
RDW: 13.4 % (ref 12.3–15.4)
WBC: 7.6 10*3/uL (ref 3.4–10.8)

## 2017-05-20 LAB — TSH: TSH: 2.9 u[IU]/mL (ref 0.450–4.500)

## 2017-05-20 LAB — VITAMIN D 25 HYDROXY (VIT D DEFICIENCY, FRACTURES): Vit D, 25-Hydroxy: 14.2 ng/mL — ABNORMAL LOW (ref 30.0–100.0)

## 2017-05-25 ENCOUNTER — Other Ambulatory Visit: Payer: Self-pay | Admitting: *Deleted

## 2017-05-25 MED ORDER — VITAMIN D (ERGOCALCIFEROL) 1.25 MG (50000 UNIT) PO CAPS: 50000.0000 [IU] | ORAL_CAPSULE | ORAL | 0 refills | Status: DC

## 2017-07-23 ENCOUNTER — Other Ambulatory Visit: Payer: Self-pay | Admitting: Certified Nurse Midwife

## 2017-08-03 ENCOUNTER — Other Ambulatory Visit (INDEPENDENT_AMBULATORY_CARE_PROVIDER_SITE_OTHER): Payer: 59

## 2017-08-03 DIAGNOSIS — E559 Vitamin D deficiency, unspecified: Secondary | ICD-10-CM

## 2017-08-04 LAB — VITAMIN D 25 HYDROXY (VIT D DEFICIENCY, FRACTURES): VIT D 25 HYDROXY: 28.2 ng/mL — AB (ref 30.0–100.0)

## 2017-11-13 ENCOUNTER — Encounter: Payer: Self-pay | Admitting: Family

## 2017-11-13 ENCOUNTER — Ambulatory Visit (INDEPENDENT_AMBULATORY_CARE_PROVIDER_SITE_OTHER): Payer: 59 | Admitting: Family

## 2017-11-13 VITALS — BP 117/42 | HR 88 | Temp 98.4°F | Resp 18 | Ht 60.5 in | Wt 160.6 lb

## 2017-11-13 DIAGNOSIS — L309 Dermatitis, unspecified: Secondary | ICD-10-CM | POA: Diagnosis not present

## 2017-11-13 DIAGNOSIS — R32 Unspecified urinary incontinence: Secondary | ICD-10-CM | POA: Diagnosis not present

## 2017-11-13 DIAGNOSIS — Z9889 Other specified postprocedural states: Secondary | ICD-10-CM

## 2017-11-13 DIAGNOSIS — Z8619 Personal history of other infectious and parasitic diseases: Secondary | ICD-10-CM

## 2017-11-13 DIAGNOSIS — Z8679 Personal history of other diseases of the circulatory system: Secondary | ICD-10-CM

## 2017-11-13 DIAGNOSIS — Z0000 Encounter for general adult medical examination without abnormal findings: Secondary | ICD-10-CM

## 2017-11-13 DIAGNOSIS — M722 Plantar fascial fibromatosis: Secondary | ICD-10-CM

## 2017-11-13 MED ORDER — TRIAMCINOLONE ACETONIDE 0.1 % EX CREA: 1.0000 "application " | TOPICAL_CREAM | Freq: Two times a day (BID) | CUTANEOUS | 2 refills | Status: DC

## 2017-11-13 MED ORDER — MELOXICAM 7.5 MG PO TABS: 7.5000 mg | ORAL_TABLET | Freq: Every day | ORAL | 0 refills | Status: DC

## 2017-11-13 NOTE — Progress Notes (Signed)
Subjective:    Patient ID: Traci [name], female    DOB: [birth year], 59 y.o.   MRN: [id number]  HPI  Traci [name] is a 57 yr old female who presents today to establish care.    L heel pain- reports heel pain x 2 months.  Reports that she is on her feet all the time and it hurts to put pressure on the left heel.    Eczema- gets on her hands. requests refill of triamcinolone.    Hx of cerebral aneurysm 2011. Reports that she had a severe HA at home, vomiting. Went to UC and they sent [facility] to [facility] Was told that she had a ruptured brain aneurysm.  s/p cerebral aneurysm repair with post-op seizure.  Reports that the seizure occurred post operatively after discharge.  She was treated with seizure medication "for years." She [facility] neurology at [facility].  She weaned off of meds about 2-3 years. No seizure since that time.  Reviewed most recent neuro note from 2016 in care everywhere.   Hx of HPV (anus) diagnosed during colonoscopy at age 24.    Urinary leakage-  Was better at a lower weight.  She has done pelvic floor rehab.  This helped some but it became costly.  + stress incontinence.  Not interested in surgical repair.   Review of Systems  Constitutional:       Reports that she has gained 12 pounds Wt Readings from Last 3 Encounters: 11/13/17 : 160 lb 9.6 oz (72.8 kg) 05/19/17 : 156 lb (70.8 kg) 05/13/16 : 148 lb (67.1 kg)   HENT: Negative for hearing loss and rhinorrhea.   Eyes: Negative for visual disturbance.  Respiratory: Negative for cough.   Cardiovascular: Negative for leg swelling.  Gastrointestinal: Negative for constipation and diarrhea.  Genitourinary: Negative for dysuria and frequency.  Musculoskeletal: Negative for arthralgias and myalgias.  Skin:       Hand eczema  Neurological: Negative for headaches.  Hematological: Negative for adenopathy.  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 "reports that she  feels easily overwhelmed when she is not at work."    Past Medical History:  Diagnosis Date  . 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 Allergy   . Aneurysm ([place]) 03/26/2011   brain  . S/P laparoscopic assisted vaginal hysterectomy (LAVH) 12/04/12   Robotic and bilateral salpingectomy (ovaries remain)  . Seizures ([place]) 2011   post cerebral repair   . UTI (lower urinary tract infection)    current with antibiotics     Social History   Socioeconomic History  . Marital status: Married    Spouse name: Traci [name]  . Number of children: Not on file  . Years of education: Not on file  . Highest education level: Not on file  Social Needs  . Financial resource strain: Not on file  . Food insecurity - worry: Not on file  . Food insecurity - inability: Not on file  . Transportation needs - medical: Not on file  . Transportation needs - non-medical: Not on file  Occupational History  . 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 Smoking status: Never Smoker  . Smokeless tobacco: Never Used  Substance and Sexual Activity  . Alcohol use: Yes  Alcohol/week: 0.6 oz    Types: 1 Glasses of wine per week    Comment: occas  . Drug use: No  . Sexual activity: Not Currently    Birth control/protection: Abstinence, Surgical, Post-menopausal  Other Topics Concern  . Not on file  Social History Narrative   2 children   Second marriage   Husband is living in [place]   Works at [employer]    Past Surgical History:  Procedure Laterality Date  . CEREBRAL ANEURYSM REPAIR  5/11   [facility]-released from Neurologist. Maintained on Keppra-no seizure activity since surgery  . CHOLECYSTECTOMY  03/2007   lap chole  . CYSTOSCOPY  12/04/2012   Procedure: CYSTOSCOPY;  Surgeon: Peri Maris, MD;  Location: [facility];  Service:  Gynecology;  Laterality: N/A;  . ROBOTIC ASSISTED TOTAL HYSTERECTOMY  12/04/2012   Procedure: ROBOTIC ASSISTED TOTAL HYSTERECTOMY;  Surgeon: Peri Maris, MD;  Location: [facility];  Service: Gynecology;  Laterality: Bilateral;  with bilateral salpingectomy    Family History  Problem Relation Age of Onset  . Osteoarthritis Mother   . Osteoporosis Mother   . Hypothyroidism Mother   . Abdominal Wall Hernia Father   . Heart failure Maternal Grandfather   . Heart attack Maternal Grandfather   . Cancer Paternal Grandfather        colon cancer  . Stroke Paternal Grandmother     Allergies  Allergen Reactions  . Pollen Extract Other (See Comments)    NASAL CONGESTION, SINUS DRAINAGE  . Penicillins     childhood  . Poison Ivy Extract [Poison Ivy Extract] Hives and Rash    Current Outpatient Medications on File Prior to Visit  Medication Sig Dispense Refill  . cholecalciferol (VITAMIN D) 1000 units tablet Take 1,000 Units by mouth daily.     No current facility-administered medications on file prior to visit.     BP (!) 117/42 (BP Location: Right Arm, Cuff Size: Large)   Pulse 88   Temp 98.4 F (36.9 C) (Oral)   Resp 18   Ht 5' 0.5" (1.537 m)   Wt 160 lb 9.6 oz (72.8 kg)   LMP 11/29/2012 (Exact Date)   SpO2 100%   BMI 30.85 kg/m       Objective:   Physical Exam  Constitutional: She is oriented to person, place, and time. She appears well-developed and well-nourished.  HENT:  Head: Normocephalic and atraumatic.  Mouth/Throat: No oropharyngeal exudate.  Neck: Neck supple.  Cardiovascular: Normal rate, regular rhythm and normal heart sounds.  No murmur heard. Pulmonary/Chest: Effort normal and breath sounds normal. No respiratory distress. She has no wheezes.  Musculoskeletal: She exhibits no edema.  Lymphadenopathy:    She has no cervical adenopathy.  Neurological: She is alert and oriented to person, place, and time.  Skin: Skin is warm and dry. No rash noted.    Psychiatric: She has a normal mood and affect. Her behavior is normal. Judgment and thought content normal.          Assessment & Plan:  Plantar fasciitis-  I gave her a printout of exercises to perform that may help. Also, add nsaid and continue icing.   Eczema- stable at present- rx sent for triamcinolone.   Hx of cerebral aneurysm- s/p repair. Clinically stable.    Hx of seizure- was successfully weaned of of [place] in 2016 and has had no seizures off of the medication. She has seen [facility]. Gilford Rile at [facility].   Hx of  HPV- reports that she had follow up with GYN and it was resolved.    Stress incontinence- advised trial of otc Impressa bladder supports.

## 2017-11-13 NOTE — Patient Instructions (Signed)
For plantar fasciitis continue icing your foot twice daily, begin meloxicam once daily and do stretches once daily. For stress incontinence you can try Impressa Bladder supports to see if this helps your symptoms. [facility] your flu shot at [facility].   Welcome to [place]!

## 2017-11-14 ENCOUNTER — Encounter (HOSPITAL_BASED_OUTPATIENT_CLINIC_OR_DEPARTMENT_OTHER): Payer: Self-pay

## 2017-11-14 ENCOUNTER — Ambulatory Visit (HOSPITAL_BASED_OUTPATIENT_CLINIC_OR_DEPARTMENT_OTHER)
Admission: RE | Admit: 2017-11-14 | Discharge: 2017-11-14 | Disposition: A | Discharge status: Home / Self Care | Payer: 59 | Source: Ambulatory Visit | Attending: Family | Admitting: Family

## 2017-11-14 DIAGNOSIS — Z1231 Encounter for screening mammogram for malignant neoplasm of breast: Secondary | ICD-10-CM | POA: Diagnosis present

## 2017-11-14 DIAGNOSIS — Z0000 Encounter for general adult medical examination without abnormal findings: Secondary | ICD-10-CM

## 2017-11-15 DIAGNOSIS — M722 Plantar fascial fibromatosis: Secondary | ICD-10-CM | POA: Insufficient documentation

## 2017-11-15 DIAGNOSIS — Z9889 Other specified postprocedural states: Secondary | ICD-10-CM

## 2017-11-15 DIAGNOSIS — Z8679 Personal history of other diseases of the circulatory system: Secondary | ICD-10-CM | POA: Insufficient documentation

## 2017-11-15 DIAGNOSIS — R32 Unspecified urinary incontinence: Secondary | ICD-10-CM | POA: Insufficient documentation

## 2017-11-15 DIAGNOSIS — L309 Dermatitis, unspecified: Secondary | ICD-10-CM | POA: Insufficient documentation

## 2017-11-15 HISTORY — DX: Plantar fascial fibromatosis: M72.2

## 2017-12-11 ENCOUNTER — Encounter: Payer: Self-pay | Admitting: Family

## 2017-12-11 ENCOUNTER — Ambulatory Visit (INDEPENDENT_AMBULATORY_CARE_PROVIDER_SITE_OTHER): Payer: 59 | Admitting: Family

## 2017-12-11 VITALS — BP 130/80 | HR 86 | Temp 98.7°F | Resp 16 | Ht 61.0 in | Wt 160.0 lb

## 2017-12-11 DIAGNOSIS — Z0000 Encounter for general adult medical examination without abnormal findings: Secondary | ICD-10-CM | POA: Diagnosis not present

## 2017-12-11 DIAGNOSIS — E348 Other specified endocrine disorders: Secondary | ICD-10-CM

## 2017-12-11 MED ORDER — CALCIUM CARBONATE-VITAMIN D 600-400 MG-UNIT PO TABS: 1.0000 | ORAL_TABLET | Freq: Two times a day (BID) | ORAL | Status: DC

## 2017-12-11 NOTE — Patient Instructions (Addendum)
Please contact your pharmacy to inquire about shingrix.  Add caltrate 600mg  + D(400 iu) one tab twice daily for bone health.  Schedule a routine eye exam. Try to get 30 minutes of cardio 5 days a week  You need 3000iu of vitamin D total once daily (there is 800 IU in the caltrate).

## 2017-12-11 NOTE — Progress Notes (Signed)
Subjective:    Patient ID: Traci [name], female    DOB: 04/22/1961, 57 y.o.   MRN: [id number]  HPI  Patient presents today for complete physical.  Immunizations:  Flu and tetanus up to date.  Diet: needs improvement, needs more veggies Exercise: active but not doing dedicated exercise Colonoscopy:  07/31/15- needs 10 yr follow up per pt.  Dexa:  due Pap Smear: hysterectomy Mammogram: 11/14/17 Dental: up to date Vision: due     Review of Systems  Constitutional: Negative for unexpected weight change.  HENT: Negative for hearing loss and rhinorrhea.   Eyes: Negative for visual disturbance.  Respiratory: Negative for cough and shortness of breath.   Cardiovascular: Negative for chest pain.  Gastrointestinal: Negative for constipation and diarrhea.  Genitourinary: Negative for dysuria and frequency.  Musculoskeletal: Negative for arthralgias and myalgias.  Skin: Negative for rash.       Mild eczema stable  Neurological: Negative for headaches.  Hematological: Negative for adenopathy.  Psychiatric/Behavioral:       Denies current depression/anxiety   Past Medical History:  Diagnosis Date  . 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 Allergy   . 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 S/P laparoscopic assisted vaginal hysterectomy (LAVH) 12/04/12   Robotic and bilateral salpingectomy (ovaries remain)  . 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 UTI (lower urinary tract infection)    current with antibiotics     Social History   Socioeconomic History  . Marital status: Married    Spouse name: Legrand Como  . Number of children: Not on file  . Years of education: Not on file  . Highest education level: Not on file  Social Needs  . Financial resource strain: Not on file  . Food insecurity - worry: Not on file  . Food insecurity - inability: Not on file  .  Transportation needs - medical: Not on file  . Transportation needs - non-medical: Not on file  Occupational History  . 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 Smoking status: Never Smoker  . Smokeless tobacco: Never Used  Substance and Sexual Activity  . Alcohol use: Yes    Alcohol/week: 0.6 oz    Types: 1 Glasses of wine per week    Comment: occas  . Drug use: No  . Sexual activity: Not Currently    Birth control/protection: Abstinence, Surgical, Post-menopausal  Other Topics Concern  . Not on file  Social History Narrative   2 children   Second marriage   Husband is living in [place]   Works at [employer]    Past Surgical History:  Procedure Laterality Date  . CEREBRAL ANEURYSM REPAIR  5/11   [facility]-released from Neurologist. Maintained on Keppra-no seizure activity since surgery  . CHOLECYSTECTOMY  03/2007   lap chole  . CYSTOSCOPY  12/04/2012   Procedure: CYSTOSCOPY;  Surgeon: Peri Maris, MD;  Location: [facility];  Service: Gynecology;  Laterality: N/A;  . ROBOTIC ASSISTED TOTAL HYSTERECTOMY  12/04/2012   Procedure: ROBOTIC ASSISTED TOTAL HYSTERECTOMY;  Surgeon: Peri Maris, MD;  Location: [facility];  Service: Gynecology;  Laterality: Bilateral;  with bilateral salpingectomy    Family History  Problem Relation Age of Onset  . Osteoarthritis Mother   . Osteoporosis Mother   .  Hypothyroidism Mother   . Abdominal Wall Hernia Father   . Heart failure Maternal Grandfather   . Heart attack Maternal Grandfather   . Cancer Paternal Grandfather        colon cancer  . Stroke Paternal Grandmother     Allergies  Allergen Reactions  . Pollen Extract Other (See Comments)    NASAL CONGESTION, SINUS DRAINAGE  . Penicillins     childhood  . Poison Ivy Extract [Poison Ivy Extract] Hives and Rash    Current Outpatient Medications on File Prior to Visit  Medication Sig Dispense Refill  .  cholecalciferol (VITAMIN D) 1000 units tablet Take 1,000 Units by mouth daily.    . meloxicam (MOBIC) 7.5 MG tablet Take 1 tablet (7.5 mg total) by mouth daily. 14 tablet 0  . triamcinolone cream (KENALOG) 0.1 % Apply 1 application topically 2 (two) times daily. 30 g 2   No current facility-administered medications on file prior to visit.     BP 130/80 (BP Location: Right Arm, Patient Position: Sitting, Cuff Size: Small)   Pulse 86   Temp 98.7 F (37.1 C) (Oral)   Resp 16   Ht 5\' 1"  (1.549 m)   Wt 160 lb (72.6 kg)   LMP 11/29/2012 (Exact Date)   SpO2 97%   BMI 30.23 kg/m        Objective:   Physical Exam  Physical Exam  Constitutional: She is oriented to person, place, and time. She appears well-developed and well-nourished. No distress.  HENT:  Head: skull deformity left frontal area. Right Ear: Tympanic membrane and ear canal normal.  Left Ear: Tympanic membrane and ear canal normal.  Mouth/Throat: Oropharynx is clear and moist.  Eyes: Pupils are equal, round, and reactive to light. No scleral icterus.  Neck: Normal range of motion. No thyromegaly present.  Cardiovascular: Normal rate and regular rhythm.   No murmur heard. Pulmonary/Chest: Effort normal and breath sounds normal. No respiratory distress. He has no wheezes. She has no rales. She exhibits no tenderness.  Abdominal: Soft. Bowel sounds are normal. She exhibits no distension and no mass. There is no tenderness. There is no rebound and no guarding.  Musculoskeletal: She exhibits no edema.  Lymphadenopathy:    She has no cervical adenopathy.  Neurological: She is alert and oriented to person, place, and time. She has normal patellar reflexes. She exhibits normal muscle tone. Coordination normal.  Skin: Skin is warm and dry.  Psychiatric: She has a normal mood and affect. Her behavior is normal. Judgment and thought content normal.  Breasts: Examined lying Right: Without masses, retractions, discharge or  axillary adenopathy.  Left: Without masses, retractions, discharge or axillary adenopathy.  Pelvic: deferred        Assessment & Plan:    Preventative care- discussed healthy diet, exercise weight loss. Wishes to defer labs until next year, had some labs over the summer with GYN.  mammo up to date. EKG tracing is personally reviewed.  EKG notes NSR.  No acute changes.  Will order baseline dexa. Advised pt as follows:      Please contact your pharmacy to inquire about shingrix.  Add caltrate 600mg  + D(400 iu) one tab twice daily for bone health.  Schedule a routine eye exam. Try to get 30 minutes of cardio 5 days a week  You need 3000iu of vitamin D total once daily (there is 800 IU in the caltrate).  Assessment & Plan:

## 2017-12-18 ENCOUNTER — Telehealth: Payer: Self-pay | Admitting: Family

## 2017-12-18 NOTE — Telephone Encounter (Signed)
Copied from [place] [id number]. Topic: Inquiry >> Dec 11, 2017  3:09 PM Corie Chiquito, [place] wrote: Reason for CRM: Patient calling because she would like to have an itemized bill for Nov 13, 2017. Patient does have an appointment this afternoon at 6pm and would like to have that when she comes into the office Stated she received a bill for 400 dollars and she wants to know why. She does have the number to billing and she has spoke with them as well

## 2017-12-18 NOTE — Telephone Encounter (Signed)
Reviewed patients bill and it is correct but went to patients deductible.  Called patient and explained, answered all questions and patient voiced understanding.

## 2017-12-25 ENCOUNTER — Ambulatory Visit (HOSPITAL_BASED_OUTPATIENT_CLINIC_OR_DEPARTMENT_OTHER)
Admission: RE | Admit: 2017-12-25 | Discharge: 2017-12-25 | Disposition: A | Discharge status: Home / Self Care | Payer: 59 | Source: Ambulatory Visit | Attending: Family | Admitting: Family

## 2017-12-25 DIAGNOSIS — Z13820 Encounter for screening for osteoporosis: Secondary | ICD-10-CM | POA: Insufficient documentation

## 2017-12-25 DIAGNOSIS — E348 Other specified endocrine disorders: Secondary | ICD-10-CM | POA: Diagnosis present

## 2017-12-25 DIAGNOSIS — M8588 Other specified disorders of bone density and structure, other site: Secondary | ICD-10-CM | POA: Insufficient documentation

## 2017-12-26 ENCOUNTER — Encounter: Payer: Self-pay | Admitting: Family

## 2018-01-01 NOTE — Progress Notes (Signed)
Mailed out to pt 

## 2018-04-20 ENCOUNTER — Telehealth: Payer: Self-pay | Admitting: *Deleted

## 2018-04-20 NOTE — Telephone Encounter (Signed)
Copied from [place] [id number]. Topic: Bill or Statement - Patient/Guarantor Inquiry >> Apr 20, 2018  2:00 PM Boyd Kerbs wrote: Pt. Called saying the bone density she had in 22-Jan-2023. With Radiology was coded wrong.   Her insurance is telling her that M. Inda Castle needs to put the correct Code and radiology will need to re-file with insurance to get paid.   Radiology is billing her and has late charges and is going against.  Radiology Acct # [phone]  [facility] to leave message

## 2018-04-23 NOTE — Telephone Encounter (Signed)
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  However, I am assuming her insurance is requesting preventative care diagnosis.  Do you know how to recode after it has been completed?

## 2018-04-23 NOTE — Telephone Encounter (Signed)
Should I sent paper to the insurance company?  It looks like it was completed at the [facility].

## 2018-04-23 NOTE — Telephone Encounter (Signed)
Traci [name],   I do not see these charges, she must have had at hospital or outside of epic... I think what you will need to do is send over on paper to change the primary dx to Z82.62 family hx of osteoporosis, this is on the [facility] screening for women at high risk prior to age 59.  You can leave the estridol deficiency as secondary.  Then ask them to refile corrected claim to the insurance.  I am not sure how to tell you to correct order since this was completed.  Your note supports the family hx of osteoporosis, so your note supports changing.  Hope this helps, let me know if not or any other questions.  Thank you ,  Dawn.

## 2018-04-25 NOTE — Telephone Encounter (Signed)
Not to insurance company, send to [facility] billing, they should be able to refile a corrected claim with correct diagnosis.  I will also route to [place], maybe she would know where to send correction.   Thanks,  [employer]

## 2018-04-26 ENCOUNTER — Encounter: Payer: Self-pay | Admitting: Family

## 2018-04-26 NOTE — Telephone Encounter (Signed)
Letter written. Will give to [place] Johnson to submit to billing.

## 2018-04-27 NOTE — Telephone Encounter (Signed)
Notified pt that paperwork has been submitted.

## 2018-05-29 ENCOUNTER — Encounter: Payer: Self-pay | Admitting: Certified Nurse Midwife

## 2018-05-29 ENCOUNTER — Other Ambulatory Visit: Payer: Self-pay

## 2018-05-29 ENCOUNTER — Ambulatory Visit (INDEPENDENT_AMBULATORY_CARE_PROVIDER_SITE_OTHER): Payer: 59 | Admitting: Certified Nurse Midwife

## 2018-05-29 VITALS — BP 110/70 | HR 60 | Resp 12 | Ht 60.75 in | Wt 160.0 lb

## 2018-05-29 DIAGNOSIS — Z8619 Personal history of other infectious and parasitic diseases: Secondary | ICD-10-CM | POA: Diagnosis not present

## 2018-05-29 DIAGNOSIS — N951 Menopausal and female climacteric states: Secondary | ICD-10-CM

## 2018-05-29 DIAGNOSIS — Z01419 Encounter for gynecological examination (general) (routine) without abnormal findings: Secondary | ICD-10-CM

## 2018-05-29 NOTE — Progress Notes (Signed)
57 y.o. G3P2002 Married  Caucasian Fe here for annual exam. Many changes in past year. Spouse moved to [place] again to maintain current position and retirement. 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  Menopausal no HRT. Denies vaginal bleeding or vaginal dryness. Established with  PCP Debbrah Alar for yearly exam and labs . Patient has adjusted well with spouse being gone, due to frequent visits. Eating not as well as she would like, but only 4 pounds of weight gain.  No other health issues today.   Patient's last menstrual period was 11/29/2012 (exact date).          Sexually active: No.  The current method of family planning is status post hysterectomy.    Exercising: No.  exercise Smoker:  no  Health Maintenance: Pap:  05-12-15 neg HPV HR neg History of Abnormal Pap:  abnormal smear of anus HPV MMG:  11-14-17 category b density birads 1:neg Self Breast exams: no Colonoscopy:  2016 f/u 23yrs BMD:   2019 TDaP:  2014 Shingles: no Pneumonia: no Hep C and HIV: both neg 2017 Labs: with PCP   reports that she has never smoked. She has never used smokeless tobacco. She reports that she drinks alcohol. She reports that she does not use drugs.  Past Medical History:  Diagnosis Date  . 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 Allergy   . 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 S/P laparoscopic assisted vaginal hysterectomy (LAVH) 12/04/12   Robotic and bilateral salpingectomy (ovaries remain)  . 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 UTI (lower urinary tract infection)    current with antibiotics    Past Surgical History:  Procedure Laterality Date  . CEREBRAL ANEURYSM REPAIR  5/11   [facility]-released from Neurologist. Maintained on Keppra-no seizure activity since surgery  . CHOLECYSTECTOMY  03/2007    lap chole  . CYSTOSCOPY  12/04/2012   Procedure: CYSTOSCOPY;  Surgeon: Peri Maris, MD;  Location: [facility];  Service: Gynecology;  Laterality: N/A;  . ROBOTIC ASSISTED TOTAL HYSTERECTOMY  12/04/2012   Procedure: ROBOTIC ASSISTED TOTAL HYSTERECTOMY;  Surgeon: Peri Maris, MD;  Location: [facility];  Service: Gynecology;  Laterality: Bilateral;  with bilateral salpingectomy    Current Outpatient Medications  Medication Sig Dispense Refill  . Calcium Carbonate-Vitamin D (CALTRATE 600+D) 600-400 MG-UNIT tablet Take 1 tablet by mouth 2 (two) times daily.    . cholecalciferol (VITAMIN D) 1000 units tablet Take 1,000 Units by mouth daily.    . Multiple Vitamins-Minerals (MULTIVITAMIN PO) Take by mouth.     No current facility-administered medications for this visit.     Family History  Problem Relation Age of Onset  . Osteoarthritis Mother   . Osteoporosis Mother   . Hypothyroidism Mother   . Hashimoto's thyroiditis Mother   . Abdominal Wall Hernia Father   . Benign prostatic hyperplasia Father   . Atrial fibrillation Father   . Heart failure Maternal Grandfather   . Heart attack Maternal Grandfather   . Cancer Paternal Grandfather        colon cancer  . Stroke Paternal Grandmother   . Hashimoto's thyroiditis Brother     ROS:  Pertinent items are noted in HPI.  Otherwise, a comprehensive ROS was negative.  Exam:  BP 110/70   Pulse 60   Resp 12   Ht 5' 0.75" (1.543 m)   Wt 160 lb (72.6 kg)   LMP 11/29/2012 (Exact Date)   BMI 30.48 kg/m  Height: 5' 0.75" (154.3 cm) Ht Readings from Last 3 Encounters:  05/29/18 5' 0.75" (1.543 m)  12/11/17 5\' 1"  (1.549 m)  11/13/17 5' 0.5" (1.537 m)    General appearance: alert, cooperative and appears stated age Head: Normocephalic, without obvious abnormality, atraumatic Neck: no adenopathy, supple, symmetrical, trachea midline and thyroid normal to inspection and palpation Lungs: clear to auscultation bilaterally Breasts: normal  appearance, no masses or tenderness, No nipple retraction or dimpling, No nipple discharge or bleeding, No axillary or supraclavicular adenopathy Heart: regular rate and rhythm Abdomen: soft, non-tender; no masses,  no organomegaly Extremities: extremities normal, atraumatic, no cyanosis or edema Skin: Skin color, texture, turgor normal. No rashes or lesions Lymph nodes: Cervical, supraclavicular, and axillary nodes normal. No abnormal inguinal nodes palpated Neurologic: Grossly normal   Pelvic: External genitalia:  no lesions, normal female, no atrophic appearance              Urethra:  normal appearing urethra with no masses, tenderness or lesions              Bartholin's and Skene's: normal                 Vagina: normal appearing vagina with normal color and discharge, no lesions              Cervix: absent              Pap taken: No. Bimanual Exam:  Uterus:  uterus absent              Adnexa: normal adnexa and no mass, fullness, tenderness               Rectovaginal: Confirms               Anus:  normal sphincter tone, no lesions, no genital warts noted  Chaperone present: yes  A:  Well Woman with normal exam  Menopausal no HRT  Social change with spouse living out state  Established with PCP this year  History of Anal HPV, none noted today  P:   Reviewed health and wellness pertinent to exam  Discussed vaginal dryness with OTC coconut oil, she has used and working well  Discussed SBE monthly and given shower car as reminder. Questions addressed.  Discussed keeping open conversation with spouse to avoid issues.  Keep next visit with PCP and labs.  Pap smear: no   counseled on breast self exam, mammography screening, feminine hygiene, adequate intake of calcium and vitamin D, diet and exercise  return annually or prn  An After Visit Summary was printed and given to the patient.

## 2018-05-29 NOTE — Patient Instructions (Signed)

## 2018-12-12 ENCOUNTER — Ambulatory Visit (INDEPENDENT_AMBULATORY_CARE_PROVIDER_SITE_OTHER): Payer: 59 | Admitting: Family

## 2018-12-12 ENCOUNTER — Encounter: Payer: Self-pay | Admitting: Family

## 2018-12-12 VITALS — BP 109/67 | HR 91 | Temp 98.7°F | Resp 16 | Ht 60.75 in | Wt 162.0 lb

## 2018-12-12 DIAGNOSIS — Z23 Encounter for immunization: Secondary | ICD-10-CM | POA: Diagnosis not present

## 2018-12-12 DIAGNOSIS — Z0000 Encounter for general adult medical examination without abnormal findings: Secondary | ICD-10-CM | POA: Diagnosis not present

## 2018-12-12 LAB — HEPATIC FUNCTION PANEL
ALK PHOS: 81 U/L (ref 39–117)
ALT: 7 U/L (ref 0–35)
AST: 6 U/L (ref 0–37)
Albumin: 4.1 g/dL (ref 3.5–5.2)
BILIRUBIN DIRECT: 0.1 mg/dL (ref 0.0–0.3)
Total Bilirubin: 0.6 mg/dL (ref 0.2–1.2)
Total Protein: 6.9 g/dL (ref 6.0–8.3)

## 2018-12-12 LAB — CBC WITH DIFFERENTIAL/PLATELET
Basophils Absolute: 0 10*3/uL (ref 0.0–0.1)
Basophils Relative: 1 % (ref 0.0–3.0)
EOS ABS: 0.1 10*3/uL (ref 0.0–0.7)
Eosinophils Relative: 1.7 % (ref 0.0–5.0)
HCT: 38.6 % (ref 36.0–46.0)
HEMOGLOBIN: 12.9 g/dL (ref 12.0–15.0)
LYMPHS ABS: 1.7 10*3/uL (ref 0.7–4.0)
Lymphocytes Relative: 35.6 % (ref 12.0–46.0)
MCHC: 33.4 g/dL (ref 30.0–36.0)
MCV: 85.2 fl (ref 78.0–100.0)
MONO ABS: 0.3 10*3/uL (ref 0.1–1.0)
Monocytes Relative: 7.3 % (ref 3.0–12.0)
NEUTROS PCT: 54.4 % (ref 43.0–77.0)
Neutro Abs: 2.6 10*3/uL (ref 1.4–7.7)
Platelets: 239 10*3/uL (ref 150.0–400.0)
RBC: 4.53 Mil/uL (ref 3.87–5.11)
RDW: 12.8 % (ref 11.5–15.5)
WBC: 4.8 10*3/uL (ref 4.0–10.5)

## 2018-12-12 LAB — URINALYSIS, ROUTINE W REFLEX MICROSCOPIC
Bilirubin Urine: NEGATIVE
Hgb urine dipstick: NEGATIVE
Ketones, ur: NEGATIVE
Leukocytes, UA: NEGATIVE
Nitrite: NEGATIVE
PH: 6.5 (ref 5.0–8.0)
RBC / HPF: NONE SEEN (ref 0–?)
Specific Gravity, Urine: 1.025 (ref 1.000–1.030)
Total Protein, Urine: NEGATIVE
Urine Glucose: NEGATIVE
Urobilinogen, UA: 0.2 (ref 0.0–1.0)

## 2018-12-12 LAB — TSH: TSH: 2.33 u[IU]/mL (ref 0.35–4.50)

## 2018-12-12 LAB — LIPID PANEL
CHOL/HDL RATIO: 3
Cholesterol: 200 mg/dL (ref 0–200)
HDL: 75.2 mg/dL (ref >39.00)
LDL Cholesterol: 113 mg/dL — ABNORMAL HIGH (ref 0–99)
NonHDL: 124.67
Triglycerides: 56 mg/dL (ref 0.0–149.0)
VLDL: 11.2 mg/dL (ref 0.0–40.0)

## 2018-12-12 LAB — BASIC METABOLIC PANEL
BUN: 15 mg/dL (ref 6–23)
CALCIUM: 9.3 mg/dL (ref 8.4–10.5)
CHLORIDE: 107 meq/L (ref 96–112)
CO2: 30 meq/L (ref 19–32)
Creatinine, Ser: 0.59 mg/dL (ref 0.40–1.20)
GFR: 104.29 mL/min (ref >60.00)
GLUCOSE: 85 mg/dL (ref 70–99)
POTASSIUM: 4.8 meq/L (ref 3.5–5.1)
SODIUM: 146 meq/L — AB (ref 135–145)

## 2018-12-12 NOTE — Progress Notes (Signed)
Subjective:    Patient ID: Traci [name], female    DOB: 11-13-1960, 58 y.o.   MRN: [id number]  HPI  Patient presents today for complete physical.  Immunizations: due for flu shot Diet: needs improvement, secondary to stress.  Living alone and working crazy hours.  Works 5 days a week.  Exercise:  No  Colonoscopy: 07/31/15, due 2021 Dexa: 2019  Pap Smear: hysterectomy Mammogram:  1/19 Vision:  11/19 Dental: up to date Wt Readings from Last 3 Encounters:  12/12/18 162 lb (73.5 kg)  05/29/18 160 lb (72.6 kg)  12/11/17 160 lb (72.6 kg)        Review of Systems  Constitutional: Negative for unexpected weight change.  HENT: Negative for hearing loss and rhinorrhea.   Eyes: Negative for visual disturbance.  Respiratory: Negative for cough and shortness of breath.   Cardiovascular: Negative for chest pain.  Gastrointestinal: Negative for blood in stool, constipation and diarrhea.  Genitourinary: Negative for dysuria, frequency and hematuria.  Musculoskeletal: Negative for arthralgias and myalgias.  Skin: Negative for rash.  Neurological: Negative for headaches.  Hematological: Negative for adenopathy.  Psychiatric/Behavioral:       Has some fatigue/work stress but overall things are going well   Past Medical History:  Diagnosis Date  . 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 Allergy   . 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 S/P laparoscopic assisted vaginal hysterectomy (LAVH) 12/04/12   Robotic and bilateral salpingectomy (ovaries remain)  . 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 UTI (lower urinary tract infection)    current with antibiotics     Social History   Socioeconomic History  . Marital status: Married    Spouse name: Legrand Como  . Number of children: Not on file  . Years of education: Not on file  . Highest education level: Not on file    Occupational History  . 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 Financial resource strain: Not on file  . Food insecurity:    Worry: Not on file    Inability: Not on file  . Transportation needs:    Medical: Not on file    Non-medical: Not on file  Tobacco Use  . Smoking status: Never Smoker  . Smokeless tobacco: Never Used  Substance and Sexual Activity  . Alcohol use: Yes    Alcohol/week: 0.0 - 1.0 standard drinks  . Drug use: No  . Sexual activity: Not Currently    Partners: Male    Birth control/protection: Surgical    Comment: hysterectomy  Lifestyle  . Physical activity:    Days per week: Not on file    Minutes per session: Not on file  . Stress: Not on file  Relationships  . Social connections:    Talks on phone: Not on file    Gets together: Not on file    Attends religious service: Not on file    Active member of club or organization: Not on file    Attends meetings of clubs or organizations: Not on file    Relationship status: Not on file  . Intimate partner violence:    Fear of current or ex partner: Not on file    Emotionally abused: Not on file    Physically abused: Not on file  Forced sexual activity: Not on file  Other Topics Concern  . Not on file  Social History Narrative   2 children   Second marriage   Husband is living in [place]   Works at [employer]    Past Surgical History:  Procedure Laterality Date  . CEREBRAL ANEURYSM REPAIR  5/11   [facility]-released from Neurologist. Maintained on Keppra-no seizure activity since surgery  . CHOLECYSTECTOMY  03/2007   lap chole  . CYSTOSCOPY  12/04/2012   Procedure: CYSTOSCOPY;  Surgeon: Peri Maris, MD;  Location: [facility];  Service: Gynecology;  Laterality: N/A;  . ROBOTIC ASSISTED TOTAL HYSTERECTOMY  12/04/2012   Procedure: ROBOTIC ASSISTED TOTAL HYSTERECTOMY;  Surgeon: Peri Maris, MD;  Location: [facility];  Service:  Gynecology;  Laterality: Bilateral;  with bilateral salpingectomy    Family History  Problem Relation Age of Onset  . Osteoarthritis Mother   . Osteoporosis Mother   . Hypothyroidism Mother   . Hashimoto's thyroiditis Mother   . Abdominal Wall Hernia Father   . Benign prostatic hyperplasia Father   . Atrial fibrillation Father   . Heart failure Maternal Grandfather   . Heart attack Maternal Grandfather   . Cancer Paternal Grandfather        colon cancer  . Stroke Paternal Grandmother   . Hashimoto's thyroiditis Brother     Allergies  Allergen Reactions  . Pollen Extract Other (See Comments)    NASAL CONGESTION, SINUS DRAINAGE  . Penicillins     childhood  . Poison Ivy Extract [Poison Ivy Extract] Hives and Rash    Current Outpatient Medications on File Prior to Visit  Medication Sig Dispense Refill  . Calcium Carbonate-Vitamin D (CALTRATE 600+D) 600-400 MG-UNIT tablet Take 1 tablet by mouth 2 (two) times daily. (Patient not taking: Reported on 12/12/2018)    . cholecalciferol (VITAMIN D) 1000 units tablet Take 1,000 Units by mouth daily.    . Multiple Vitamins-Minerals (MULTIVITAMIN PO) Take by mouth.     No current facility-administered medications on file prior to visit.     BP 109/67 (BP Location: Right Arm, Patient Position: Sitting, Cuff Size: Small)   Pulse 91   Temp 98.7 F (37.1 C) (Oral)   Resp 16   Ht 5' 0.75" (1.543 m)   Wt 162 lb (73.5 kg)   LMP 11/29/2012 (Exact Date)   SpO2 98%   BMI 30.86 kg/m       Objective:   Physical Exam  Physical Exam  Constitutional: She is oriented to person, place, and time. She appears well-developed and well-nourished. No distress.  HENT:  Head: Normocephalic and atraumatic.  Right Ear: Tympanic membrane and ear canal normal.  Left Ear: Tympanic membrane and ear canal normal.  Mouth/Throat: Oropharynx is clear and moist.  Eyes: Pupils are equal, round, and reactive to light. No scleral icterus.  Neck: Normal range  of motion. No thyromegaly present.  Cardiovascular: Normal rate and regular rhythm.   No murmur heard. Pulmonary/Chest: Effort normal and breath sounds normal. No respiratory distress. He has no wheezes. She has no rales. She exhibits no tenderness.  Abdominal: Soft. Bowel sounds are normal. She exhibits no distension and no mass. There is no tenderness. There is no rebound and no guarding.  Musculoskeletal: She exhibits no edema.  Lymphadenopathy:    She has no cervical adenopathy.  Neurological: She is alert and oriented to person, place, and time. She has normal patellar reflexes. She exhibits normal muscle tone. Coordination  normal.  Skin: Skin is warm and dry.  Psychiatric: She has a normal mood and affect. Her behavior is normal. Judgment and thought content normal.  Breasts: Examined lying Right: Without masses, retractions, discharge or axillary adenopathy.  Left: Without masses, retractions, discharge or axillary adenopathy.  Pelvic: deferred          Assessment & Plan:   Preventative care- Discussed healthy diet, exercise and weight loss.  Flu shot up to date. Obtain routine lab work. Refer for mammogram.      Assessment & Plan:

## 2018-12-12 NOTE — Addendum Note (Signed)
Addended by: Jiles Prows on: 12/12/2018 11:13 AM   Modules accepted: Orders

## 2018-12-12 NOTE — Patient Instructions (Addendum)
Please complete lab work prior to leaving.  Please try to add regular walking to your routine. Work on [employer].

## 2018-12-14 ENCOUNTER — Encounter: Payer: Self-pay | Admitting: Family

## 2018-12-18 LAB — VITAMIN D 1,25 DIHYDROXY
Vitamin D 1, 25 (OH)2 Total: 30 pg/mL (ref 18–72)
Vitamin D3 1, 25 (OH)2: 30 pg/mL

## 2018-12-24 ENCOUNTER — Ambulatory Visit (HOSPITAL_BASED_OUTPATIENT_CLINIC_OR_DEPARTMENT_OTHER)
Admission: RE | Admit: 2018-12-24 | Discharge: 2018-12-24 | Disposition: A | Discharge status: Home / Self Care | Payer: 59 | Source: Ambulatory Visit | Attending: Family | Admitting: Family

## 2018-12-24 ENCOUNTER — Encounter (HOSPITAL_BASED_OUTPATIENT_CLINIC_OR_DEPARTMENT_OTHER): Payer: Self-pay

## 2018-12-24 DIAGNOSIS — Z0000 Encounter for general adult medical examination without abnormal findings: Secondary | ICD-10-CM

## 2018-12-24 DIAGNOSIS — Z1231 Encounter for screening mammogram for malignant neoplasm of breast: Secondary | ICD-10-CM | POA: Diagnosis present

## 2019-03-06 ENCOUNTER — Ambulatory Visit (INDEPENDENT_AMBULATORY_CARE_PROVIDER_SITE_OTHER): Payer: 59

## 2019-03-06 ENCOUNTER — Other Ambulatory Visit: Payer: Self-pay

## 2019-03-06 DIAGNOSIS — Z23 Encounter for immunization: Secondary | ICD-10-CM | POA: Diagnosis not present

## 2019-03-12 ENCOUNTER — Ambulatory Visit: Payer: 59

## 2019-06-05 ENCOUNTER — Ambulatory Visit: Payer: 59 | Admitting: Certified Nurse Midwife

## 2019-06-14 ENCOUNTER — Ambulatory Visit (INDEPENDENT_AMBULATORY_CARE_PROVIDER_SITE_OTHER): Payer: 59 | Admitting: Certified Nurse Midwife

## 2019-06-14 ENCOUNTER — Other Ambulatory Visit: Payer: Self-pay

## 2019-06-14 ENCOUNTER — Encounter: Payer: Self-pay | Admitting: Certified Nurse Midwife

## 2019-06-14 ENCOUNTER — Telehealth: Payer: Self-pay | Admitting: *Deleted

## 2019-06-14 VITALS — BP 110/70 | HR 68 | Temp 97.2°F | Resp 16 | Ht 60.75 in | Wt 157.0 lb

## 2019-06-14 DIAGNOSIS — N951 Menopausal and female climacteric states: Secondary | ICD-10-CM | POA: Diagnosis not present

## 2019-06-14 DIAGNOSIS — Z01411 Encounter for gynecological examination (general) (routine) with abnormal findings: Secondary | ICD-10-CM

## 2019-06-14 DIAGNOSIS — N6310 Unspecified lump in the right breast, unspecified quadrant: Secondary | ICD-10-CM | POA: Diagnosis not present

## 2019-06-14 NOTE — Telephone Encounter (Signed)
Spoke with Traci [name] at [facility], right breast Dx MMG and [place], if needed, on 06/19/19 at 10:10am, arriving at 9:50am.   Call placed to patient, advised of appt at [facility] as seen above.  Patient is agreeable to date and time, contact information provided.   Routing to provider for final review. Patient is agreeable to disposition. Will close encounter.

## 2019-06-14 NOTE — Progress Notes (Signed)
58 y.o. G20P2002 Married  Caucasian Fe here for annual exam. Menopausal no HRT. Denies  vaginal dryness issues. Not sexual activity. Had primary care visit earlier this year with labs, all normal. Started shingle vaccine. No health issues today.  Patient's last menstrual period was 11/29/2012 (exact date).          Sexually active: Yes.    The current method of family planning is status post hysterectomy.    Exercising: No.  exercise Smoker:  no  Review of Systems  Constitutional: Negative.   HENT: Negative.   Eyes: Negative.   Respiratory: Negative.   Cardiovascular: Negative.   Gastrointestinal: Negative.   Genitourinary: Negative.   Musculoskeletal: Negative.   Skin: Negative.   Neurological: Negative.   Endo/Heme/Allergies: Negative.   Psychiatric/Behavioral: Negative.     Health Maintenance: Pap:  05-12-15 neg HPV HR neg History of Abnormal Pap: abnormal smear of anus HPV MMG:  12-24-2018 category c density birads 1:neg Self Breast exams: no Colonoscopy:  2016 f/u 35yrs BMD:   2019 normal TDaP:  2014 Shingles: 2020 Pneumonia: no Hep C and HIV: both neg 2017 Labs: with PCP   reports that she has never smoked. She has never used smokeless tobacco. She reports current alcohol use. She reports that she does not use drugs.  Past Medical History:  Diagnosis Date  . 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 Allergy   . 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 S/P laparoscopic assisted vaginal hysterectomy (LAVH) 12/04/12   Robotic and bilateral salpingectomy (ovaries remain)  . 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 UTI (lower urinary tract infection)    current with antibiotics    Past Surgical History:  Procedure Laterality Date  . CEREBRAL ANEURYSM REPAIR  5/11   [facility]-released from Neurologist. Maintained on Keppra-no seizure activity since surgery   . CHOLECYSTECTOMY  03/2007   lap chole  . CYSTOSCOPY  12/04/2012   Procedure: CYSTOSCOPY;  Surgeon: Peri Maris, MD;  Location: [facility];  Service: Gynecology;  Laterality: N/A;  . ROBOTIC ASSISTED TOTAL HYSTERECTOMY  12/04/2012   Procedure: ROBOTIC ASSISTED TOTAL HYSTERECTOMY;  Surgeon: Peri Maris, MD;  Location: [facility];  Service: Gynecology;  Laterality: Bilateral;  with bilateral salpingectomy    No current outpatient medications on file.   No current facility-administered medications for this visit.     Family History  Problem Relation Age of Onset  . Osteoarthritis Mother   . Osteoporosis Mother   . Hypothyroidism Mother   . Hashimoto's thyroiditis Mother   . Abdominal Wall Hernia Father   . Benign prostatic hyperplasia Father   . Atrial fibrillation Father   . Heart failure Maternal Grandfather   . Heart attack Maternal Grandfather   . Cancer Paternal Grandfather        colon cancer  . Stroke Paternal Grandmother   . Hashimoto's thyroiditis Brother     ROS:  Pertinent items are noted in HPI.  Otherwise, a comprehensive ROS was negative.  Exam:   BP 110/70   Pulse 68   Temp (!) 97.2 F (36.2 C) (Skin)   Resp 16   Ht 5' 0.75" (1.543 m)   Wt 157 lb (71.2 kg)   LMP 11/29/2012 (Exact Date)   BMI 29.91 kg/m  Height: 5' 0.75" (154.3 cm) Ht Readings from Last 3 Encounters:  06/14/19 5'  0.75" (1.543 m)  12/12/18 5' 0.75" (1.543 m)  05/29/18 5' 0.75" (1.543 m)    General appearance: alert, cooperative and appears stated age Head: Normocephalic, without obvious abnormality, atraumatic Neck: no adenopathy, supple, symmetrical, trachea midline and thyroid normal to inspection and palpation Lungs: clear to auscultation bilaterally Breasts: normal appearance, no masses or tenderness, No nipple retraction or dimpling, No nipple discharge or bleeding, No axillary or supraclavicular adenopathy, right breast mass noted at 12-1 o'clock outer edge of breast, not mobile,  non tender, patient palpated area. Heart: regular rate and rhythm Abdomen: soft, non-tender; no masses,  no organomegaly Extremities: extremities normal, atraumatic, no cyanosis or edema Skin: Skin color, texture, turgor normal. No rashes or lesions Lymph nodes: Cervical, supraclavicular, and axillary nodes normal. No abnormal inguinal nodes palpated Neurologic: Grossly normal   Pelvic: External genitalia:  no lesions              Urethra:  normal appearing urethra with no masses, tenderness or lesions              Bartholin's and Skene's: normal                 Vagina: normal appearing vagina with normal color and discharge, no lesions              Cervix: absent              Pap taken: No. Bimanual Exam:  Uterus:  uterus absent              Adnexa: no mass, fullness, tenderness, normal palpation               Rectovaginal: Confirms               Anus:  normal sphincter tone, no lesions  Chaperone present: yes  A:  Well Woman with normal exam  Menopausal no HRT S/P hysterectomy ovaries retained  Right breast mass     P:   Reviewed health and wellness pertinent to exam  Discussed finding of breast mass and need for evaluation. Questions addressed and had patient palpate area. She will be called with information regarding appointment for diagnostic mammogram and [place].  Pap smear: no   counseled on breast self exam, mammography screening, feminine hygiene, adequate intake of calcium and vitamin D, diet and exercise  return annually or prn  An After Visit Summary was printed and given to the patient.

## 2019-06-14 NOTE — Telephone Encounter (Signed)
-----   Message from Regina Eck, CNM sent at 06/14/2019  1:41 PM EDT ----- Patient needs diagnostic mammogram and [place] due to right breast mass. Last mammogram 12/2018. She is aware she will be called with information.

## 2019-06-19 ENCOUNTER — Telehealth: Payer: Self-pay | Admitting: *Deleted

## 2019-06-19 ENCOUNTER — Other Ambulatory Visit: Payer: Self-pay

## 2019-06-19 ENCOUNTER — Ambulatory Visit
Admission: RE | Admit: 2019-06-19 | Discharge: 2019-06-19 | Disposition: A | Discharge status: Home / Self Care | Payer: 59 | Source: Ambulatory Visit | Attending: Certified Nurse Midwife | Admitting: Certified Nurse Midwife

## 2019-06-19 DIAGNOSIS — N6310 Unspecified lump in the right breast, unspecified quadrant: Secondary | ICD-10-CM

## 2019-06-19 NOTE — Telephone Encounter (Signed)
-----   Message from Regina Eck, CNM sent at 06/19/2019  1:40 PM EDT ----- Mammogram showed normal fibroglandular tissue in area of concern. Would like to recheck this area.

## 2019-06-19 NOTE — Telephone Encounter (Signed)
Notes recorded by Burnice Logan, RN on 06/19/2019 at 3:29 PM EDT  Left message to call Sharee Pimple, RN at [facility].

## 2019-06-20 NOTE — Telephone Encounter (Signed)
Since it was fibroglandular tissue and evaluated appropriately she can decline visit. She needs to do SBE monthly and any change needs to come in.

## 2019-06-20 NOTE — Telephone Encounter (Signed)
Patient returned call. Message given to patient as seen below from Debbi. Patient verbalized understanding. Patient asking if follow up visit really necessary? Patient states she and 3 other people (2 technicians and MD) were unable to feel the area of concern Debbi felt when she had imaging done. Patient states she is scared what that bill for imaging will cost her for them to find everything was normal. Patient states she is "happy everything is normal" but doesn't want to follow up here if it is going to cost her. Patient states she is happy to follow up if she has concerns in the future. RN advised would need to review with Debbi and return call. Patient agreeable.   Routing to provider for review.

## 2019-06-20 NOTE — Telephone Encounter (Signed)
Message left to return call to Triage Nurse at [phone].    

## 2019-06-21 NOTE — Telephone Encounter (Signed)
Call to patient. Message given to patient as seen below from Debbi. Patient aware to call back if any changes noted.   Routing to provider and will close encounter.

## 2019-07-02 NOTE — Telephone Encounter (Signed)
Dr. Sabra Heck -ok to remove from [facility] hold?

## 2019-07-03 NOTE — Telephone Encounter (Signed)
Yes, ok to remove from [facility] hold.  Thanks. 

## 2019-07-03 NOTE — Telephone Encounter (Signed)
Patient removed from [facility] hold .  

## 2019-08-16 ENCOUNTER — Ambulatory Visit: Payer: Self-pay | Admitting: *Deleted

## 2019-08-16 NOTE — Telephone Encounter (Signed)
FYI

## 2019-08-16 NOTE — Telephone Encounter (Signed)
Pt called in c/o her lips being swollen from Chipotle peppers in adobo sauce in a casserole she has been eating on 3 days (helpings) this week. Her lower lip is very swollen and tight..  It's very uncomfortable.  Denies swelling inside of mouth to tongue or throat.   Instructed if she does start to have swelling inside her mouth to get to the ED quickly.    She was agreeable to this.  I called into [facility] office.   They checked with Melissa and she referred pt to the ED due to the allergic reaction.  I let pt know this.   "Does she know what that's going to cost me?"   "I know she's erring on the side of caution but they can't do anything in the office for an allergic reaction?"    I let her know that Melissa felt it better to go to the ED for the type of reaction you are having. "ok".    "I might go to an urgent care center instead".    "Can they give me medications for the reaction?"    I let her know they could.   "That will probably be cheaper than the ED".      "I'll discuss it with my husband and we'll decide what we're going to do".   She thanked me for my help.  I again instructed her to go to the ED if her mouth, etc began to swell.    She was agreeable.   "I will go straight to the emergency room if I happens, I assure you".  I sent my notes to [facility] office.    Reason for Disposition . [1] Mild lip swelling from food reaction AND [2] diagnosis never confirmed by a HCP  Answer Assessment - Initial Assessment Questions 1. ONSET: "When did the swelling start?" (e.g., minutes, hours, days)     My lips are swelling from eating an Chipotles pepper in adobo sauce.   They came in a can.   They are already cooked.    My husband put it into a casserole.   I've had it 3 times this week.    On Thursday morning my lips are huge and today it's worse 2. SEVERITY: "How swollen is it?"     Lower lip is huge and my upper lip is a little swollen.  My lower lip feels tight and  is dry which is uncomfortable. I've taken some Benadryl last night but none this morning.    3. ITCHING: "Is there any itching?" If so, ask: "How much?"   (Scale 1-10; mild, moderate or severe)     A little but not really. 4. PAIN: "Is the swelling painful to touch?" If so, ask: "How painful is it?"   (Scale 1-10; mild, moderate or severe)     *No Answer*Lower lip hurts because it's stretched so big.   I can't close my mouth 5. CAUSE: "What do you think is causing the lip swelling?"     See above  The pepper and sauce. 6. RECURRENT SYMPTOM: "Have you had lip swelling before?" If so, ask: "When was the last time?" "What happened that time?"     No 7. OTHER SYMPTOMS: "Do you have any other symptoms?" (e.g., toothache)     No other symptoms 8. PREGNANCY: "Is there any chance you are pregnant?" "When was your last menstrual period?"     N/A  Protocols used: [facility]

## 2019-11-09 IMAGING — MG DIGITAL SCREENING BILATERAL MAMMOGRAM WITH TOMO AND CAD
6 of 10 series · 6 of 30 positions shown · non-contrast
Comparison: Previous exam(s).

CLINICAL DATA: Screening.

EXAM:
DIGITAL SCREENING BILATERAL MAMMOGRAM WITH TOMO AND CAD

[L CC synth-2D]
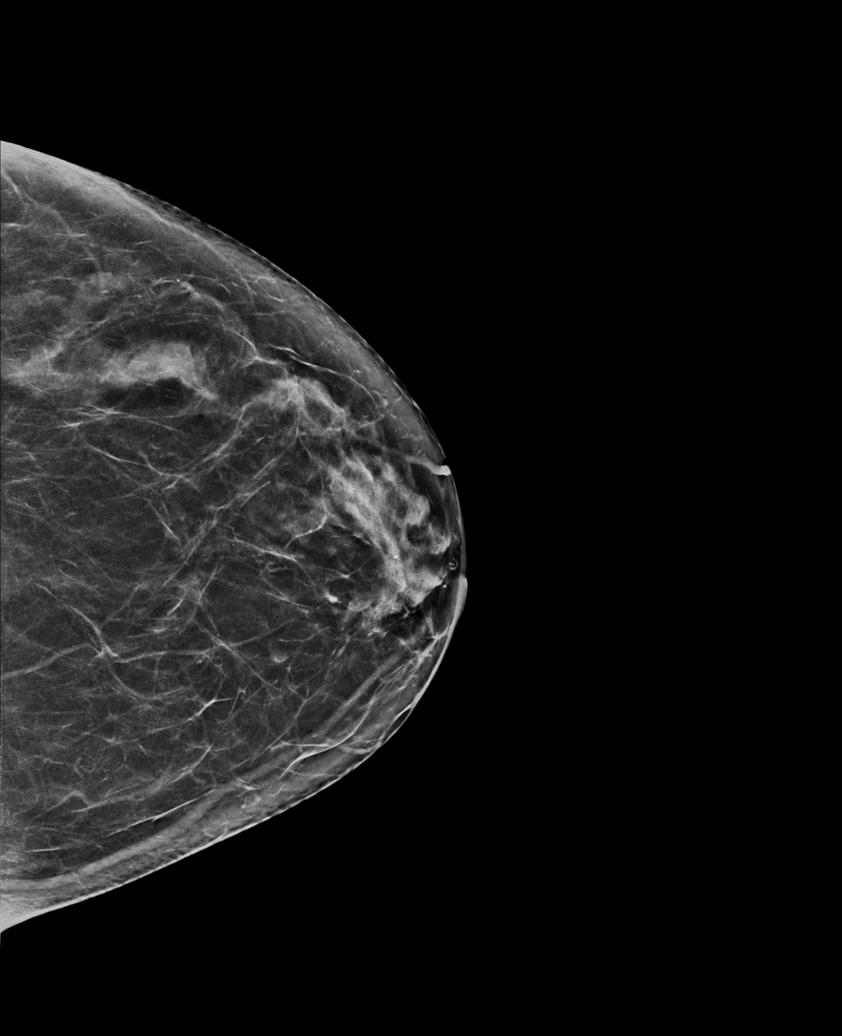

[L MLO synth-2D]
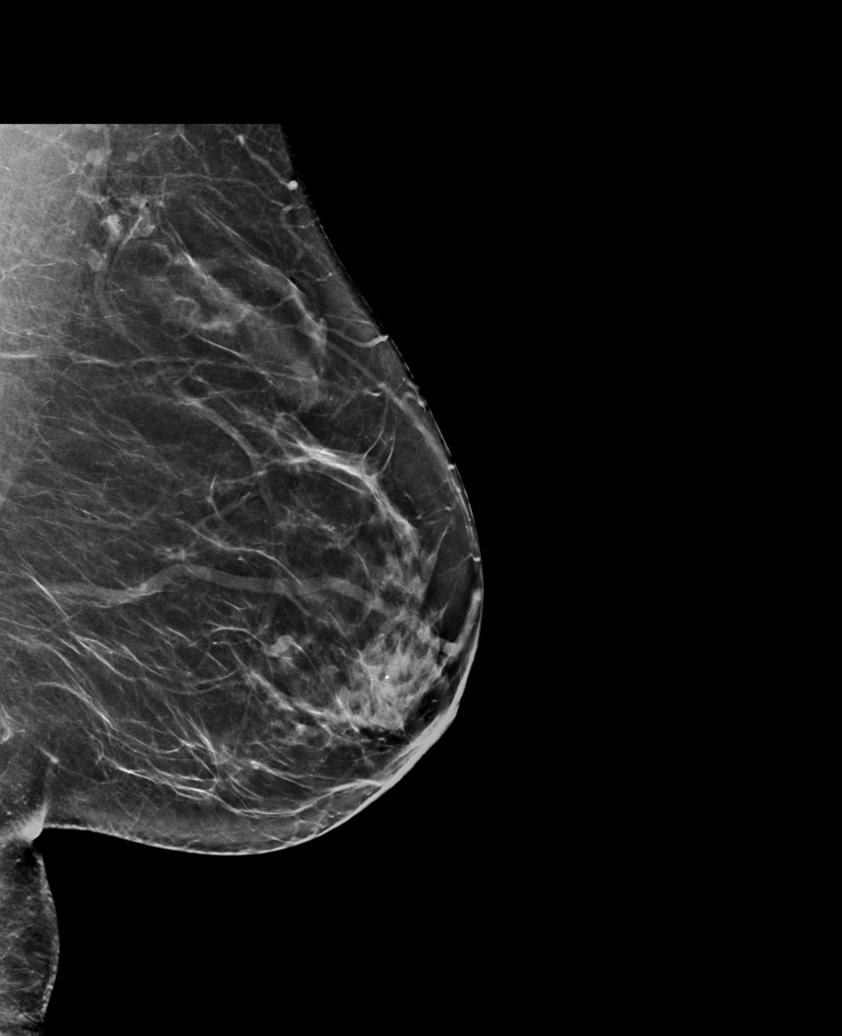

[R MLO synth-2D (1 of 2)]
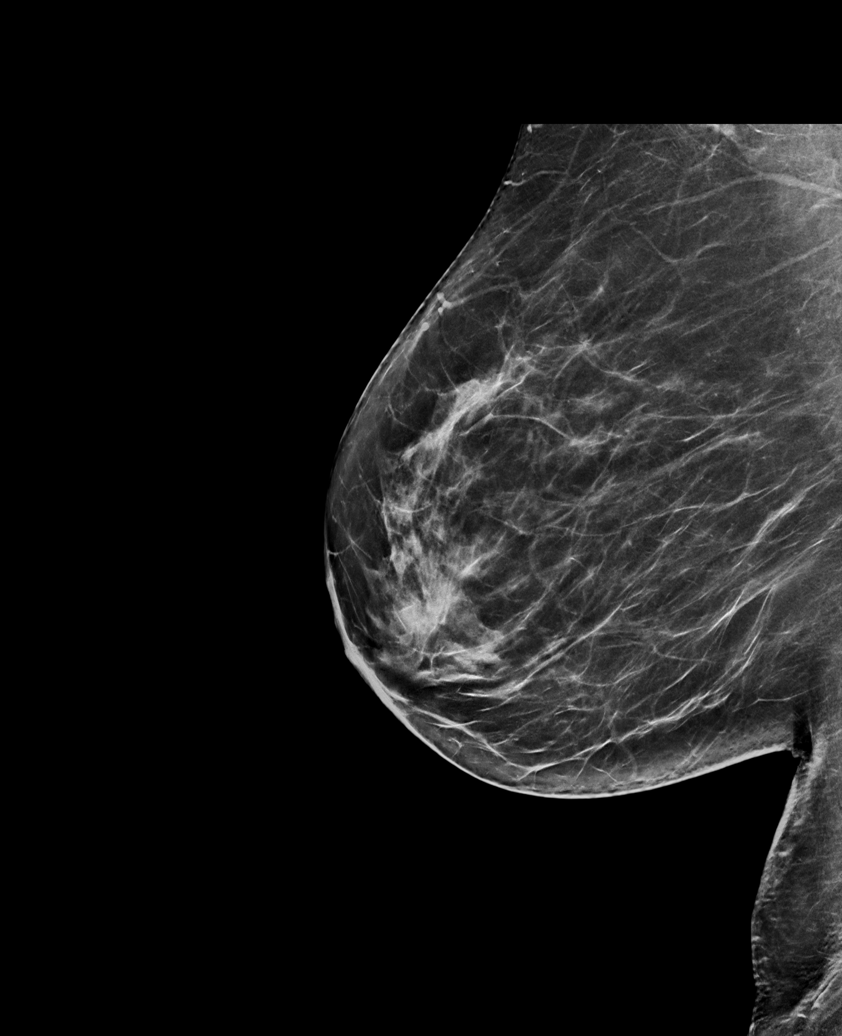

[R CC synth-2D]
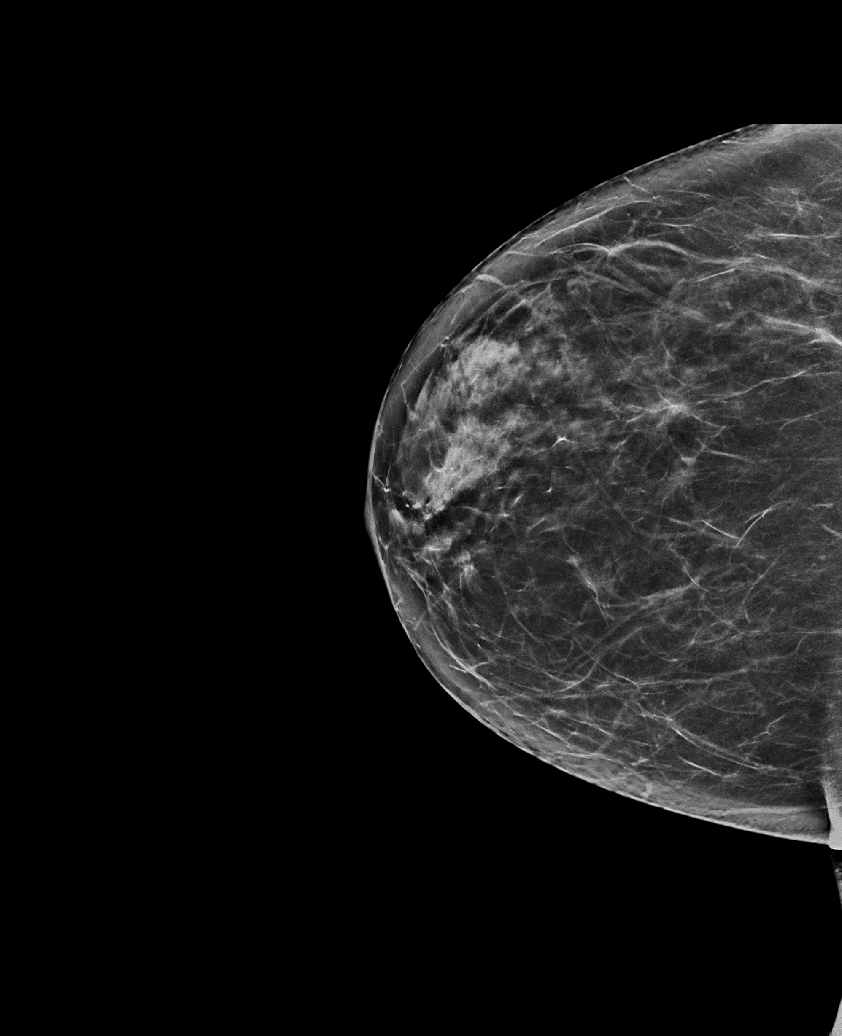

[R MLO synth-2D (2 of 2)]
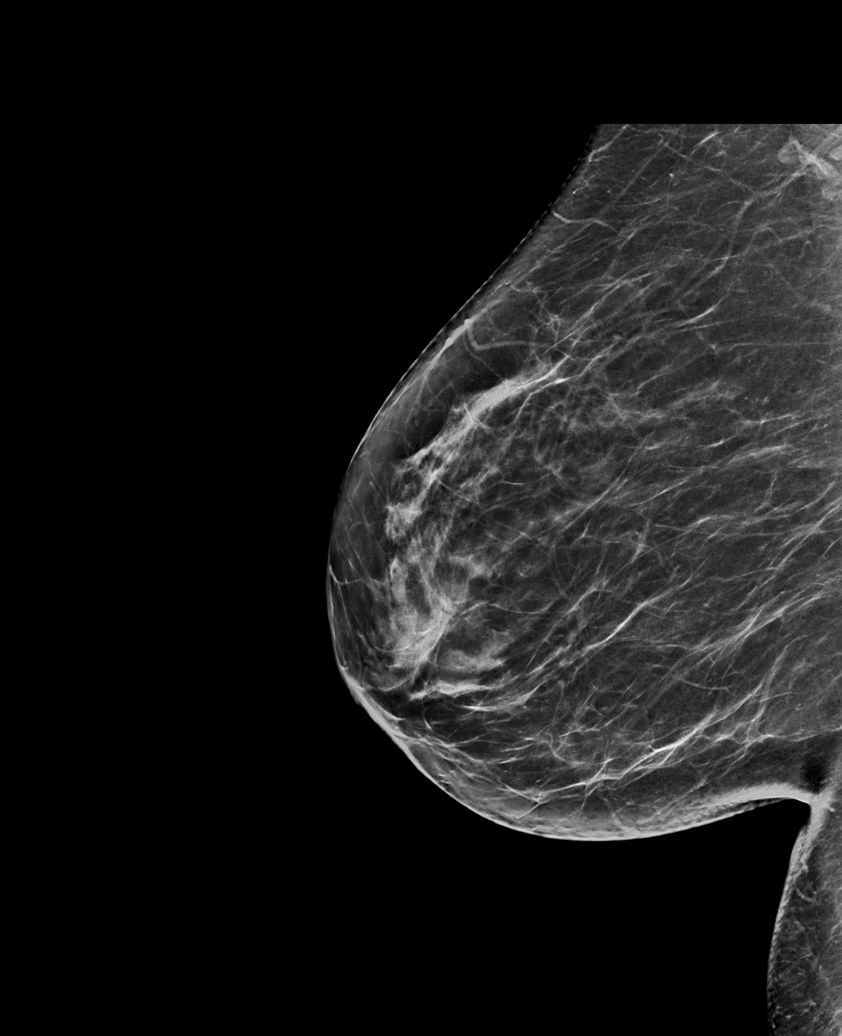

[L MLO tomo · tomo slice 37/72.0]
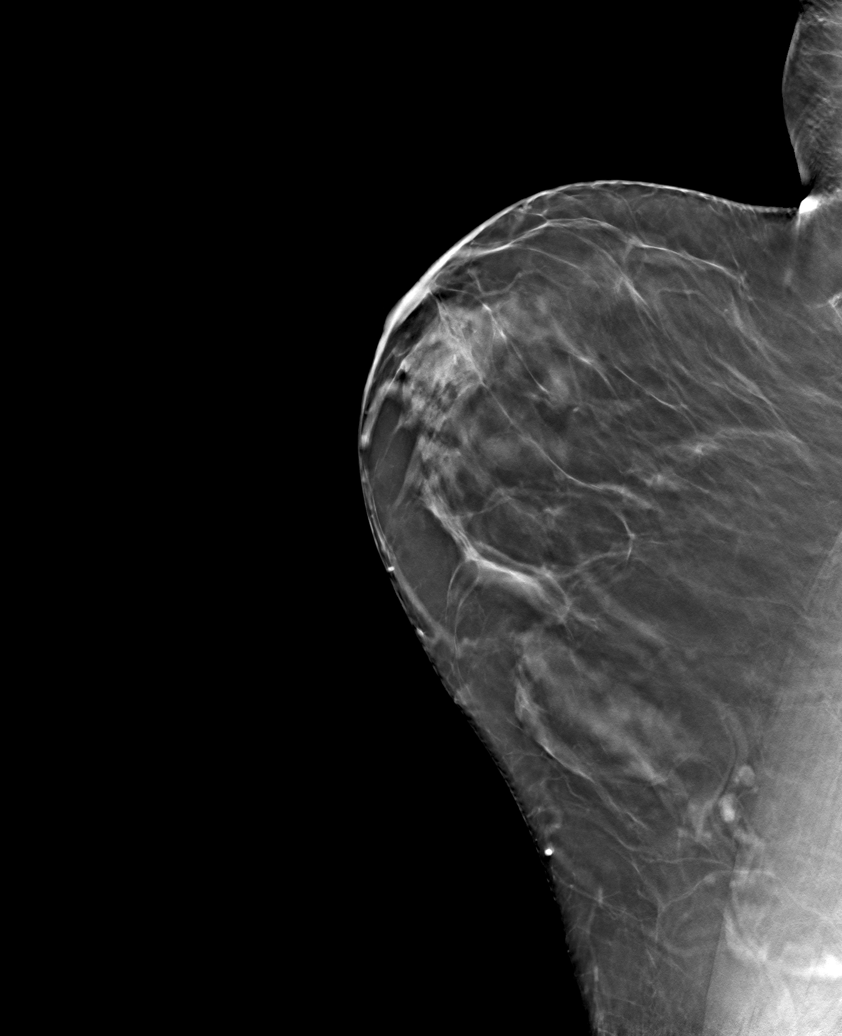

[6 of 30 positions shown; findings below may reference images not displayed]

ACR Breast Density Category c: The breast tissue is heterogeneously
dense, which may obscure small masses.
FINDINGS: There are no findings suspicious for malignancy. Images were
processed with CAD.
IMPRESSION: No mammographic evidence of malignancy. A result letter of this
screening mammogram will be mailed directly to the patient.

RECOMMENDATION:
Screening mammogram in one year. (Code:FT-U-LHB)

BI-RADS CATEGORY  1: Negative.

## 2019-12-16 ENCOUNTER — Other Ambulatory Visit: Payer: Self-pay

## 2019-12-17 ENCOUNTER — Ambulatory Visit (INDEPENDENT_AMBULATORY_CARE_PROVIDER_SITE_OTHER): Payer: 59 | Admitting: Family

## 2019-12-17 ENCOUNTER — Other Ambulatory Visit: Payer: Self-pay

## 2019-12-17 ENCOUNTER — Encounter: Payer: Self-pay | Admitting: Family

## 2019-12-17 VITALS — BP 116/70 | HR 103 | Temp 97.0°F | Resp 16 | Ht 61.0 in | Wt 157.0 lb

## 2019-12-17 DIAGNOSIS — Z0000 Encounter for general adult medical examination without abnormal findings: Secondary | ICD-10-CM

## 2019-12-17 LAB — CBC WITH DIFFERENTIAL/PLATELET
Basophils Absolute: 0 10*3/uL (ref 0.0–0.1)
Basophils Relative: 0.8 % (ref 0.0–3.0)
Eosinophils Absolute: 0.1 10*3/uL (ref 0.0–0.7)
Eosinophils Relative: 1.4 % (ref 0.0–5.0)
HCT: 39.6 % (ref 36.0–46.0)
Hemoglobin: 13 g/dL (ref 12.0–15.0)
Lymphocytes Relative: 29.8 % (ref 12.0–46.0)
Lymphs Abs: 1.7 10*3/uL (ref 0.7–4.0)
MCHC: 32.7 g/dL (ref 30.0–36.0)
MCV: 86.5 fl (ref 78.0–100.0)
Monocytes Absolute: 0.4 10*3/uL (ref 0.1–1.0)
Monocytes Relative: 6.4 % (ref 3.0–12.0)
Neutro Abs: 3.4 10*3/uL (ref 1.4–7.7)
Neutrophils Relative %: 61.6 % (ref 43.0–77.0)
Platelets: 256 10*3/uL (ref 150.0–400.0)
RBC: 4.58 Mil/uL (ref 3.87–5.11)
RDW: 13 % (ref 11.5–15.5)
WBC: 5.6 10*3/uL (ref 4.0–10.5)

## 2019-12-17 LAB — LIPID PANEL
Cholesterol: 203 mg/dL — ABNORMAL HIGH (ref 0–200)
HDL: 83.5 mg/dL (ref >39.00)
LDL Cholesterol: 105 mg/dL — ABNORMAL HIGH (ref 0–99)
NonHDL: 119.8
Total CHOL/HDL Ratio: 2
Triglycerides: 73 mg/dL (ref 0.0–149.0)
VLDL: 14.6 mg/dL (ref 0.0–40.0)

## 2019-12-17 LAB — BASIC METABOLIC PANEL
BUN: 15 mg/dL (ref 6–23)
CO2: 31 mEq/L (ref 19–32)
Calcium: 8.9 mg/dL (ref 8.4–10.5)
Chloride: 104 mEq/L (ref 96–112)
Creatinine, Ser: 0.65 mg/dL (ref 0.40–1.20)
GFR: 92.94 mL/min (ref >60.00)
Glucose, Bld: 67 mg/dL — ABNORMAL LOW (ref 70–99)
Potassium: 4.6 mEq/L (ref 3.5–5.1)
Sodium: 142 mEq/L (ref 135–145)

## 2019-12-17 LAB — TSH: TSH: 1.87 u[IU]/mL (ref 0.35–4.50)

## 2019-12-17 LAB — HEPATIC FUNCTION PANEL
ALT: 9 U/L (ref 0–35)
AST: 7 U/L (ref 0–37)
Albumin: 3.9 g/dL (ref 3.5–5.2)
Alkaline Phosphatase: 77 U/L (ref 39–117)
Bilirubin, Direct: 0.1 mg/dL (ref 0.0–0.3)
Total Bilirubin: 0.4 mg/dL (ref 0.2–1.2)
Total Protein: 6.7 g/dL (ref 6.0–8.3)

## 2019-12-17 NOTE — Progress Notes (Signed)
Subjective:    Patient ID: Traci [name], female    DOB: 1960/12/27, 59 y.o.   MRN: [id number]  HPI  Patient is a 59 yr old female who presents today for complete physical.  Immunizations:  Up to date Diet:  Fair diet Exercise: walks at work, no formal exercise Wt Readings from Last 3 Encounters:  12/17/19 157 lb (71.2 kg)  06/14/19 157 lb (71.2 kg)  12/12/18 162 lb (73.5 kg)  Colonoscopy: 2016- up to date Dexa: 2019- will plan 2022 follow up Pap Smear: hysterectomy Mammogram: due Vision: due Dental: up to date     Review of Systems  Constitutional: Negative for unexpected weight change.  HENT: Negative for hearing loss and rhinorrhea.   Eyes: Negative for visual disturbance.  Respiratory: Negative for cough and shortness of breath.   Cardiovascular: Negative for chest pain.  Gastrointestinal: Negative for constipation and diarrhea.  Genitourinary: Negative for dysuria, frequency and hematuria.  Musculoskeletal: Negative for arthralgias and myalgias.  Skin: Negative for rash.       Sometimes dry ski  Neurological: Positive for headaches (reports that she sometimes wakes up with a headache, no significant snoring, denies daytime somnolence).  Hematological: Negative for adenopathy.  Psychiatric/Behavioral:       Situational stressors, no true depression/anxiety concerns   Past Medical History:  Diagnosis Date  . 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 Allergy   . 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 S/P laparoscopic assisted vaginal hysterectomy (LAVH) 12/04/12   Robotic and bilateral salpingectomy (ovaries remain)  . 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 UTI (lower urinary tract infection)    current with antibiotics     Social History   Socioeconomic History  . Marital status: Married    Spouse name: Legrand Como  . Number of children: Not on  file  . Years of education: Not on file  . Highest education level: Not on file  Occupational History  . 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 Smoking status: Never Smoker  . Smokeless tobacco: Never Used  Substance and Sexual Activity  . Alcohol use: Yes    Alcohol/week: 0.0 - 1.0 standard drinks  . Drug use: No  . Sexual activity: Not Currently    Partners: Male    Birth control/protection: Surgical    Comment: hysterectomy  Other Topics Concern  . Not on file  Social History Narrative   2 children   Second marriage   Husband is living in [place]   Works at [employer] and [employer]:   . Difficulty of Paying Living Expenses: Not on file  Food Insecurity:   . Worried About Charity fundraiser in the Last Year: Not on file  . Ran Out of Food in the Last Year: Not on file  Transportation Needs:   . Lack of Transportation (Medical): Not on file  . Lack of Transportation (Non-Medical): Not on file  Physical Activity:   . Days of Exercise per Week: Not on file  . Minutes of Exercise per Session: Not on file  Stress:   . Feeling of Stress : Not on file  Social Connections:   . Frequency of Communication with Friends and Family: Not on file  .  Frequency of Social Gatherings with Friends and Family: Not on file  . Attends Religious Services: Not on file  . Active Member of Clubs or Organizations: Not on file  . Attends Archivist Meetings: Not on file  . Marital Status: Not on file  Intimate Partner Violence:   . Fear of Current or Ex-Partner: Not on file  . Emotionally Abused: Not on file  . Physically Abused: Not on file  . Sexually Abused: Not on file    Past Surgical History:  Procedure Laterality Date  . CEREBRAL ANEURYSM REPAIR  5/11   [facility]-released from Neurologist. Maintained on Keppra-no seizure activity since surgery  .  CHOLECYSTECTOMY  03/2007   lap chole  . CYSTOSCOPY  12/04/2012   Procedure: CYSTOSCOPY;  Surgeon: Peri Maris, MD;  Location: [facility];  Service: Gynecology;  Laterality: N/A;  . ROBOTIC ASSISTED TOTAL HYSTERECTOMY  12/04/2012   Procedure: ROBOTIC ASSISTED TOTAL HYSTERECTOMY;  Surgeon: Peri Maris, MD;  Location: [facility];  Service: Gynecology;  Laterality: Bilateral;  with bilateral salpingectomy    Family History  Problem Relation Age of Onset  . Osteoarthritis Mother   . Osteoporosis Mother   . Hypothyroidism Mother   . Hashimoto's thyroiditis Mother   . Abdominal Wall Hernia Father   . Benign prostatic hyperplasia Father   . Atrial fibrillation Father   . Heart failure Maternal Grandfather   . Heart attack Maternal Grandfather   . Cancer Paternal Grandfather        colon cancer  . Stroke Paternal Grandmother   . Hashimoto's thyroiditis Brother     Allergies  Allergen Reactions  . Pollen Extract Other (See Comments)    NASAL CONGESTION, SINUS DRAINAGE  . Penicillins     childhood  . Poison Ivy Extract [Poison Ivy Extract] Hives and Rash    No current outpatient medications on file prior to visit.   No current facility-administered medications on file prior to visit.    BP 116/70 (BP Location: Right Arm, Patient Position: Sitting, Cuff Size: Small)   Pulse (!) 103   Temp (!) 97 F (36.1 C) (Temporal)   Resp 16   Ht 5\' 1"  (1.549 m)   Wt 157 lb (71.2 kg)   LMP 11/29/2012 (Exact Date)   SpO2 99%   BMI 29.66 kg/m       Objective:   Physical Exam  Physical Exam  Constitutional: She is oriented to person, place, and time. She appears well-developed and well-nourished. No distress.  HENT:  Head: Normocephalic and atraumatic.  Right Ear: Tympanic membrane and ear canal normal.  Left Ear: Tympanic membrane and ear canal normal.  Mouth/Throat: Oropharynx is clear and moist.  Eyes: Pupils are equal, round, and reactive to light. No scleral icterus.  Neck:  Normal range of motion. No thyromegaly present.  Cardiovascular: Normal rate and regular rhythm.   No murmur heard. Pulmonary/Chest: Effort normal and breath sounds normal. No respiratory distress. He has no wheezes. She has no rales. She exhibits no tenderness.  Abdominal: Soft. Bowel sounds are normal. She exhibits no distension and no mass. There is no tenderness. There is no rebound and no guarding.  Musculoskeletal: She exhibits no edema.  Lymphadenopathy:    She has no cervical adenopathy.  Neurological: She is alert and oriented to person, place, and time. She has normal patellar reflexes. She exhibits normal muscle tone. Coordination normal.  Skin: Skin is warm and dry.  Psychiatric: She has a normal mood and  affect. Her behavior is normal. Judgment and thought content normal.  Breasts: Examined lying Right: Without masses, retractions, discharge or axillary adenopathy.  Left: Without masses, retractions, discharge or axillary adenopathy.  Pelvic: deferred           Assessment & Plan:   Preventative care- discussed healthy diet, exercise and weight loss.  Refer for mammogram. Obtain routine lab work. Immunizations reviewed and up to date. Colo up to date. She will let me know if HA's worsen in AM or if increased snoring/daytime somnolence. Would consider sleep study at that time.  This visit occurred during the SARS-CoV-2 public health emergency.  Safety protocols were in place, including screening questions prior to the visit, additional usage of staff PPE, and extensive cleaning of exam room while observing appropriate contact time as indicated for disinfecting solutions.        Assessment & Plan:

## 2019-12-17 NOTE — Patient Instructions (Addendum)
Below are two ways to schedule a Covid-19 Vaccine:  Please visit [email or web address] to register or call [phone]  Or call:  [facility] Covid-19 vaccine scheduling at [phone]    Preventive Care 41-61 Years Old, Female Preventive care refers to visits with your health care provider and lifestyle choices that can promote health and wellness. This includes:  A yearly physical exam. This may also be called an annual well check.  Regular dental visits and eye exams.  Immunizations.  Screening for certain conditions.  Healthy lifestyle choices, such as eating a healthy diet, getting regular exercise, not using drugs or products that contain nicotine and tobacco, and limiting alcohol use. What can I expect for my preventive care visit? Physical exam Your health care provider will check your:  Height and weight. This may be used to calculate body mass index (BMI), which tells if you are at a healthy weight.  Heart rate and blood pressure.  Skin for abnormal spots. Counseling Your health care provider may ask you questions about your:  Alcohol, tobacco, and drug use.  Emotional well-being.  Home and relationship well-being.  Sexual activity.  Eating habits.  Work and work Statistician.  Method of birth control.  Menstrual cycle.  Pregnancy history. What immunizations do I need?  Influenza (flu) vaccine  This is recommended every year. Tetanus, diphtheria, and pertussis (Tdap) vaccine  You may need a Td booster every 10 years. Varicella (chickenpox) vaccine  You may need this if you have not been vaccinated. Zoster (shingles) vaccine  You may need this after age 75. Measles, mumps, and rubella (MMR) vaccine  You may need at least one dose of MMR if you were born in 1957 or later. You may also need a second dose. Pneumococcal conjugate (PCV13) vaccine  You may need this if you have certain conditions and were not previously  vaccinated. Pneumococcal polysaccharide (PPSV23) vaccine  You may need one or two doses if you smoke cigarettes or if you have certain conditions. Meningococcal conjugate (MenACWY) vaccine  You may need this if you have certain conditions. Hepatitis A vaccine  You may need this if you have certain conditions or if you travel or work in places where you may be exposed to hepatitis A. Hepatitis B vaccine  You may need this if you have certain conditions or if you travel or work in places where you may be exposed to hepatitis B. Haemophilus influenzae type b (Hib) vaccine  You may need this if you have certain conditions. Human papillomavirus (HPV) vaccine  If recommended by your health care provider, you may need three doses over 6 months. You may receive vaccines as individual doses or as more than one vaccine together in one shot (combination vaccines). Talk with your health care provider about the risks and benefits of combination vaccines. What tests do I need? Blood tests  Lipid and cholesterol levels. These may be checked every 5 years, or more frequently if you are over 73 years old.  Hepatitis C test.  Hepatitis B test. Screening  Lung cancer screening. You may have this screening every year starting at age 54 if you have a 30-pack-year history of smoking and currently smoke or have quit within the past 15 years.  Colorectal cancer screening. All adults should have this screening starting at age 26 and continuing until age 58. Your health care provider may recommend screening at age 52 if you are at increased risk. You will have tests every 1-10 years, depending on  your results and the type of screening test.  Diabetes screening. This is done by checking your blood sugar (glucose) after you have not eaten for a while (fasting). You may have this done every 1-3 years.  Mammogram. This may be done every 1-2 years. Talk with your health care provider about when you should start  having regular mammograms. This may depend on whether you have a family history of breast cancer.  BRCA-related cancer screening. This may be done if you have a family history of breast, ovarian, tubal, or peritoneal cancers.  Pelvic exam and Pap test. This may be done every 3 years starting at age 61. Starting at age 62, this may be done every 5 years if you have a Pap test in combination with an HPV test. Other tests  Sexually transmitted disease (STD) testing.  Bone density scan. This is done to screen for osteoporosis. You may have this scan if you are at high risk for osteoporosis. Follow these instructions at home: Eating and drinking  Eat a diet that includes fresh fruits and vegetables, whole grains, lean protein, and low-fat dairy.  Take vitamin and mineral supplements as recommended by your health care provider.  Do not drink alcohol if: ? Your health care provider tells you not to drink. ? You are pregnant, may be pregnant, or are planning to become pregnant.  If you drink alcohol: ? Limit how much you have to 0-1 drink a day. ? Be aware of how much alcohol is in your drink. In the [place] one drink equals one 12 oz bottle of beer (355 mL), one 5 oz glass of wine (148 mL), or one 1 oz glass of hard liquor (44 mL). Lifestyle  Take daily care of your teeth and gums.  Stay active. Exercise for at least 30 minutes on 5 or more days each week.  Do not use any products that contain nicotine or tobacco, such as cigarettes, e-cigarettes, and chewing tobacco. If you need help quitting, ask your health care provider.  If you are sexually active, practice safe sex. Use a condom or other form of birth control (contraception) in order to prevent pregnancy and STIs (sexually transmitted infections).  If told by your health care provider, take low-dose aspirin daily starting at age 78. What's next?  Visit your health care provider once a year for a well check visit.  Ask your health  care provider how often you should have your eyes and teeth checked.  Stay up to date on all vaccines. This information is not intended to replace advice given to you by your health care provider. Make sure you discuss any questions you have with your health care provider. Document Revised: 07/05/2018 Document Reviewed: 07/05/2018 Elsevier Patient Education  2020 [employer].

## 2019-12-18 ENCOUNTER — Encounter: Payer: Self-pay | Admitting: Family

## 2019-12-25 ENCOUNTER — Ambulatory Visit (HOSPITAL_BASED_OUTPATIENT_CLINIC_OR_DEPARTMENT_OTHER)
Admission: RE | Admit: 2019-12-25 | Discharge: 2019-12-25 | Disposition: A | Discharge status: Home / Self Care | Payer: 59 | Source: Ambulatory Visit | Attending: Family | Admitting: Family

## 2019-12-25 ENCOUNTER — Other Ambulatory Visit: Payer: Self-pay

## 2019-12-25 DIAGNOSIS — Z0000 Encounter for general adult medical examination without abnormal findings: Secondary | ICD-10-CM

## 2019-12-25 DIAGNOSIS — Z1231 Encounter for screening mammogram for malignant neoplasm of breast: Secondary | ICD-10-CM | POA: Diagnosis present

## 2020-01-29 ENCOUNTER — Encounter: Payer: Self-pay | Admitting: Certified Nurse Midwife

## 2020-06-16 ENCOUNTER — Ambulatory Visit: Payer: 59 | Admitting: Certified Nurse Midwife

## 2020-06-25 NOTE — Progress Notes (Signed)
59 y.o. G29P2002 Married White or Caucasian Not Hispanic or Latino female here for annual exam.  H/O hysterectomy/bilateral salpingectomies. She is having some urine leakage.   Not sexually active.   She c/o a long h/o urinary leakage. Leaks with sneezing, running, on the way to the bathroom. Leaks daily, some days are worse than others. The urge incontinence is more frequent. She wears an incontinence pad, leaks small amounts. She feels it has worsened in the last year, thinks associated with her increase in her weight.  Not drinking a lot of caffeine.     H/O HPV effect noted on a lesion during colonoscopy, no high grade changes. Last colonoscopy was in 9/16 with a polyp. F/U in 10 years.   Patient's last menstrual period was 11/29/2012 (exact date).          Sexually active: No.  The current method of family planning is status post hysterectomy.    Exercising: No.  The patient does not participate in regular exercise at present. Smoker:  no  Health Maintenance: Pap:   05-12-15 neg HPV HR neg History of abnormal Pap:  Yes abnormal smear of anus HPV 8/14 MMG:  12/25/19 Bi-rads 1 neg  BMD:   2019 Normal  Colonoscopy: 2016 WNL f/u 10 years  TDaP:  10/25/13 Gardasil: NA   reports that she has never smoked. She has never used smokeless tobacco. She reports current alcohol use. She reports that she does not use drugs. Works for [employer], considering leaving and working for a friend. Son lives in [place], works in [place] with start up businesses. Daughter lives in [place], works as a Pharmacist, hospital.   Past Medical History:  Diagnosis Date  . 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 Allergy   . 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 S/P laparoscopic assisted vaginal hysterectomy (LAVH) 12/04/12   Robotic and bilateral salpingectomy (ovaries remain)  . 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 UTI (lower urinary tract infection)    current with antibiotics    Past Surgical History:  Procedure Laterality Date  . CEREBRAL ANEURYSM REPAIR  5/11   [facility]-released from Neurologist. Maintained on Keppra-no seizure activity since surgery  . CHOLECYSTECTOMY  03/2007   lap chole  . CYSTOSCOPY  12/04/2012   Procedure: CYSTOSCOPY;  Surgeon: Peri Maris, MD;  Location: [facility];  Service: Gynecology;  Laterality: N/A;  . ROBOTIC ASSISTED TOTAL HYSTERECTOMY  12/04/2012   Procedure: ROBOTIC ASSISTED TOTAL HYSTERECTOMY;  Surgeon: Peri Maris, MD;  Location: [facility];  Service: Gynecology;  Laterality: Bilateral;  with bilateral salpingectomy    No current outpatient medications on file.   No current facility-administered medications for this visit.    Family History  Problem Relation Age of Onset  . Osteoarthritis Mother   . Osteoporosis Mother   . Hypothyroidism Mother   . Hashimoto's thyroiditis Mother   . Abdominal Wall Hernia Father   . Benign prostatic hyperplasia Father   . Atrial fibrillation Father   . Heart failure Maternal Grandfather   . Heart attack Maternal Grandfather   . Cancer Paternal Grandfather        colon cancer  . Stroke Paternal Grandmother   . Hashimoto's thyroiditis Brother     Review of Systems  All other systems reviewed and are negative.   Exam:   BP 122/64  Pulse 89   Ht 5' 0.75" (1.543 m)   Wt 160 lb 3.2 oz (72.7 kg)   LMP 11/29/2012 (Exact Date)   SpO2 99%   BMI 30.52 kg/m   Weight change: @WEIGHTCHANGE @ Height:   Height: 5' 0.75" (154.3 cm)  Ht Readings from Last 3 Encounters:  06/29/20 5' 0.75" (1.543 m)  12/17/19 5\' 1"  (1.549 m)  06/14/19 5' 0.75" (1.543 m)    General appearance: alert, cooperative and appears stated age Head: Normocephalic, without obvious abnormality, atraumatic Neck: no adenopathy, supple, symmetrical, trachea midline and thyroid normal to inspection and palpation Lungs: clear to auscultation  bilaterally Cardiovascular: regular rate and rhythm Breasts: normal appearance, no masses or tenderness Abdomen: soft, non-tender; non distended,  no masses,  no organomegaly Extremities: extremities normal, atraumatic, no cyanosis or edema Skin: Skin color, texture, turgor normal. No rashes or lesions Lymph nodes: Cervical, supraclavicular, and axillary nodes normal. No abnormal inguinal nodes palpated Neurologic: Grossly normal   Pelvic: External genitalia:  no lesions              Urethra:  normal appearing urethra with no masses, tenderness or lesions              Bartholins and Skenes: normal                 Vagina: mildly atrophic appearing vagina with normal color and discharge, no lesions. Kegel strength is 1 out of 5              Cervix: absent               Bimanual Exam:  Uterus:  uterus absent              Adnexa: no mass, fullness, tenderness               Rectovaginal: Confirms               Anus:  normal sphincter tone, no lesions  Gae Dry chaperoned for the exam.  A:  Well Woman with normal exam  Distant h/o anal HPV  Mixed incontinence, poor kegel strength. Discussed options for treatment, including PT, medication and surgery. Information on bladder training given  P:   No cervical pap needed  Anal pap with hpv  Labs with primary  Send urine for ua, c&s  Referral to pelvic floor PT for mixed urinary incontinence  Discussed breast self exam  Discussed calcium and vit D intake  Mammogram and colonoscopy UTD

## 2020-06-26 ENCOUNTER — Ambulatory Visit: Payer: 59 | Admitting: Obstetrics and Gynecology

## 2020-06-29 ENCOUNTER — Encounter: Payer: Self-pay | Admitting: Obstetrics and Gynecology

## 2020-06-29 ENCOUNTER — Other Ambulatory Visit: Payer: Self-pay

## 2020-06-29 ENCOUNTER — Ambulatory Visit (INDEPENDENT_AMBULATORY_CARE_PROVIDER_SITE_OTHER): Payer: 59 | Admitting: Obstetrics and Gynecology

## 2020-06-29 ENCOUNTER — Other Ambulatory Visit (HOSPITAL_COMMUNITY)
Admission: RE | Admit: 2020-06-29 | Discharge: 2020-06-29 | Disposition: A | Discharge status: Home / Self Care | Payer: 59 | Source: Ambulatory Visit | Attending: Obstetrics and Gynecology | Admitting: Obstetrics and Gynecology

## 2020-06-29 VITALS — BP 122/64 | HR 89 | Ht 60.75 in | Wt 160.2 lb

## 2020-06-29 DIAGNOSIS — Z8619 Personal history of other infectious and parasitic diseases: Secondary | ICD-10-CM | POA: Insufficient documentation

## 2020-06-29 DIAGNOSIS — Z01419 Encounter for gynecological examination (general) (routine) without abnormal findings: Secondary | ICD-10-CM | POA: Diagnosis not present

## 2020-06-29 DIAGNOSIS — N3946 Mixed incontinence: Secondary | ICD-10-CM | POA: Diagnosis not present

## 2020-06-29 NOTE — Patient Instructions (Signed)
Urinary Incontinence  Urinary incontinence refers to a condition in which a person is unable to control where and when to pass urine. A person with this condition will urinate when he or she does not mean to (involuntarily). What are the causes? This condition may be caused by:  Medicines.  Infections.  Constipation.  Overactive bladder muscles.  Weak bladder muscles.  Weak pelvic floor muscles. These muscles provide support for the bladder, intestine, and, in women, the uterus.  Enlarged prostate in men. The prostate is a gland near the bladder. When it gets too big, it can pinch the urethra. With the urethra blocked, the bladder can weaken and lose the ability to empty properly.  Surgery.  Emotional factors, such as anxiety, stress, or post-traumatic stress disorder (PTSD).  Pelvic organ prolapse. This happens in women when organs shift out of place and into the vagina. This shift can prevent the bladder and urethra from working properly. What increases the risk? The following factors may make you more likely to develop this condition:  Older age.  Obesity and physical inactivity.  Pregnancy and childbirth.  Menopause.  Diseases that affect the nerves or spinal cord (neurological diseases).  Long-term (chronic) coughing. This can increase pressure on the bladder and pelvic floor muscles. What are the signs or symptoms? Symptoms may vary depending on the type of urinary incontinence you have. They include:  A sudden urge to urinate, but passing urine involuntarily before you can get to a bathroom (urge incontinence).  Suddenly passing urine with any activity that forces urine to pass, such as coughing, laughing, exercise, or sneezing (stress incontinence).  Needing to urinate often, but urinating only a small amount, or constantly dribbling urine (overflow incontinence).  Urinating because you cannot get to the bathroom in time due to a physical disability, such as  arthritis or injury, or communication and thinking problems, such as Alzheimer disease (functional incontinence). How is this diagnosed? This condition may be diagnosed based on:  Your medical history.  A physical exam.  Tests, such as: ? Urine tests. ? X-rays of your kidney and bladder. ? Ultrasound. ? CT scan. ? Cystoscopy. In this procedure, a health care provider inserts a tube with a light and camera (cystoscope) through the urethra and into the bladder in order to check for problems. ? Urodynamic testing. These tests assess how well the bladder, urethra, and sphincter can store and release urine. There are different types of urodynamic tests, and they vary depending on what the test is measuring. To help diagnose your condition, your health care provider may recommend that you keep a log of when you urinate and how much you urinate. How is this treated? Treatment for this condition depends on the type of incontinence that you have and its cause. Treatment may include:  Lifestyle changes, such as: ? Quitting smoking. ? Maintaining a healthy weight. ? Staying active. Try to get 150 minutes of moderate-intensity exercise every week. Ask your health care provider which activities are safe for you. ? Eating a healthy diet.  Avoid high-fat foods, like fried foods.  Avoid refined carbohydrates like white bread and white rice.  Limit how much alcohol and caffeine you drink.  Increase your fiber intake. Foods such as fresh fruits, vegetables, beans, and whole grains are healthy sources of fiber.  Pelvic floor muscle exercises.  Bladder training, such as lengthening the amount of time between bathroom breaks, or using the bathroom at regular intervals.  Using techniques to suppress bladder urges.   This can include distraction techniques or controlled breathing exercises.  Medicines to relax the bladder muscles and prevent bladder spasms.  Medicines to help slow or prevent the  growth of a man's prostate.  Botox injections. These can help relax the bladder muscles.  Using pulses of electricity to help change bladder reflexes (electrical nerve stimulation).  For women, using a medical device to prevent urine leaks. This is a small, tampon-like, disposable device that is inserted into the urethra.  Injecting collagen or carbon beads (bulking agents) into the urinary sphincter. These can help thicken tissue and close the bladder opening.  Surgery. Follow these instructions at home: Lifestyle  Limit alcohol and caffeine. These can fill your bladder quickly and irritate it.  Keep yourself clean to help prevent odors and skin damage. Ask your doctor about special skin creams and cleansers that can protect the skin from urine.  Consider wearing pads or adult diapers. Make sure to change them regularly, and always change them right after experiencing incontinence. General instructions  Take over-the-counter and prescription medicines only as told by your health care provider.  Use the bathroom about every 3-4 hours, even if you do not feel the need to urinate. Try to empty your bladder completely every time. After urinating, wait a minute. Then try to urinate again.  Make sure you are in a relaxed position while urinating.  If your incontinence is caused by nerve problems, keep a log of the medicines you take and the times you go to the bathroom.  Keep all follow-up visits as told by your health care provider. This is important. Contact a health care provider if:  You have pain that gets worse.  Your incontinence gets worse. Get help right away if:  You have a fever or chills.  You are unable to urinate.  You have redness in your groin area or down your legs. Summary  Urinary incontinence refers to a condition in which a person is unable to control where and when to pass urine.  This condition may be caused by medicines, infection, weak bladder  muscles, weak pelvic floor muscles, enlargement of the prostate (in men), or surgery.  The following factors increase your risk for developing this condition: older age, obesity, pregnancy and childbirth, menopause, neurological diseases, and chronic coughing.  There are several types of urinary incontinence. They include urge incontinence, stress incontinence, overflow incontinence, and functional incontinence.  This condition is usually treated first with lifestyle and behavioral changes, such as quitting smoking, eating a healthier diet, and doing regular pelvic floor exercises. Other treatment options include medicines, bulking agents, medical devices, electrical nerve stimulation, or surgery. This information is not intended to replace advice given to you by your health care provider. Make sure you discuss any questions you have with your health care provider. Document Revised: 11/03/2017 Document Reviewed: 02/02/2017 Elsevier Patient Education  [place] DIET:  We recommended that you start or continue a regular exercise program for good health. Regular exercise means any activity that makes your heart beat faster and makes you sweat.  We recommend exercising at least 30 minutes per day at least 3 days a week, preferably 4 or 5.  We also recommend a diet low in fat and sugar.  Inactivity, poor dietary choices and obesity can cause diabetes, heart attack, stroke, and kidney damage, among others.    ALCOHOL AND SMOKING:  Women should limit their alcohol intake to no more than 7 drinks/beers/glasses of wine (  combined, not each!) per week. Moderation of alcohol intake to this level decreases your risk of breast cancer and liver damage. And of course, no recreational drugs are part of a healthy lifestyle.  And absolutely no smoking or even second hand smoke. Most people know smoking can cause heart and lung diseases, but did you know it also contributes to weakening of your  bones? Aging of your skin?  Yellowing of your teeth and nails?  CALCIUM AND VITAMIN D:  Adequate intake of calcium and Vitamin D are recommended.  The recommendations for exact amounts of these supplements seem to change often, but generally speaking 1,200 mg of calcium (between diet and supplement) and 800 units of Vitamin D per day seems prudent. Certain women may benefit from higher intake of Vitamin D.  If you are among these women, your doctor will have told you during your visit.    PAP SMEARS:  Pap smears, to check for cervical cancer or precancers,  have traditionally been done yearly, although recent scientific advances have shown that most women can have pap smears less often.  However, every woman still should have a physical exam from her gynecologist every year. It will include a breast check, inspection of the vulva and vagina to check for abnormal growths or skin changes, a visual exam of the cervix, and then an exam to evaluate the size and shape of the uterus and ovaries.  And after 61 years of age, a rectal exam is indicated to check for rectal cancers. We will also provide age appropriate advice regarding health maintenance, like when you should have certain vaccines, screening for sexually transmitted diseases, bone density testing, colonoscopy, mammograms, etc.   MAMMOGRAMS:  All women over 35 years old should have a yearly mammogram. Many facilities now offer a "3D" mammogram, which may cost around $50 extra out of pocket. If possible,  we recommend you accept the option to have the 3D mammogram performed.  It both reduces the number of women who will be called back for extra views which then turn out to be normal, and it is better than the routine mammogram at detecting truly abnormal areas.    COLON CANCER SCREENING: Now recommend starting at age 54. At this time colonoscopy is not covered for routine screening until 50. There are take home tests that can be done between 45-49.    COLONOSCOPY:  Colonoscopy to screen for colon cancer is recommended for all women at age 30.  We know, you hate the idea of the prep.  We agree, BUT, having colon cancer and not knowing it is worse!!  Colon cancer so often starts as a polyp that can be seen and removed at colonscopy, which can quite literally save your life!  And if your first colonoscopy is normal and you have no family history of colon cancer, most women don't have to have it again for 10 years.  Once every ten years, you can do something that may end up saving your life, right?  We will be happy to help you get it scheduled when you are ready.  Be sure to check your insurance coverage so you understand how much it will cost.  It may be covered as a preventative service at no cost, but you should check your particular policy.      Breast Self-Awareness Breast self-awareness means being familiar with how your breasts look and feel. It involves checking your breasts regularly and reporting any changes to your health care  provider. Practicing breast self-awareness is important. A change in your breasts can be a sign of a serious medical problem. Being familiar with how your breasts look and feel allows you to find any problems early, when treatment is more likely to be successful. All women should practice breast self-awareness, including women who have had breast implants. How to do a breast self-exam One way to learn what is normal for your breasts and whether your breasts are changing is to do a breast self-exam. To do a breast self-exam: Look for Changes  1. Remove all the clothing above your waist. 2. Stand in front of a mirror in a room with good lighting. 3. Put your hands on your hips. 4. Push your hands firmly downward. 5. Compare your breasts in the mirror. Look for differences between them (asymmetry), such as: ? Differences in shape. ? Differences in size. ? Puckers, dips, and bumps in one breast and not the  other. 6. Look at each breast for changes in your skin, such as: ? Redness. ? Scaly areas. 7. Look for changes in your nipples, such as: ? Discharge. ? Bleeding. ? Dimpling. ? Redness. ? A change in position. Feel for Changes Carefully feel your breasts for lumps and changes. It is best to do this while lying on your back on the floor and again while sitting or standing in the shower or tub with soapy water on your skin. Feel each breast in the following way:  Place the arm on the side of the breast you are examining above your head.  Feel your breast with the other hand.  Start in the nipple area and make  inch (2 cm) overlapping circles to feel your breast. Use the pads of your three middle fingers to do this. Apply light pressure, then medium pressure, then firm pressure. The light pressure will allow you to feel the tissue closest to the skin. The medium pressure will allow you to feel the tissue that is a little deeper. The firm pressure will allow you to feel the tissue close to the ribs.  Continue the overlapping circles, moving downward over the breast until you feel your ribs below your breast.  Move one finger-width toward the center of the body. Continue to use the  inch (2 cm) overlapping circles to feel your breast as you move slowly up toward your collarbone.  Continue the up and down exam using all three pressures until you reach your armpit.  Write Down What You Find  Write down what is normal for each breast and any changes that you find. Keep a written record with breast changes or normal findings for each breast. By writing this information down, you do not need to depend only on memory for size, tenderness, or location. Write down where you are in your menstrual cycle, if you are still menstruating. If you are having trouble noticing differences in your breasts, do not get discouraged. With time you will become more familiar with the variations in your breasts and  more comfortable with the exam. How often should I examine my breasts? Examine your breasts every month. If you are breastfeeding, the best time to examine your breasts is after a feeding or after using a breast pump. If you menstruate, the best time to examine your breasts is 5-7 days after your period is over. During your period, your breasts are lumpier, and it may be more difficult to notice changes. When should I see my health care provider? See  your health care provider if you notice:  A change in shape or size of your breasts or nipples.  A change in the skin of your breast or nipples, such as a reddened or scaly area.  Unusual discharge from your nipples.  A lump or thick area that was not there before.  Pain in your breasts.  Anything that concerns you.   Kegel Exercises  Kegel exercises can help strengthen your pelvic floor muscles. The pelvic floor is a group of muscles that support your rectum, small intestine, and bladder. In females, pelvic floor muscles also help support the womb (uterus). These muscles help you control the flow of urine and stool. Kegel exercises are painless and simple, and they do not require any equipment. Your provider may suggest Kegel exercises to:  Improve bladder and bowel control.  Improve sexual response.  Improve weak pelvic floor muscles after surgery to remove the uterus (hysterectomy) or pregnancy (females).  Improve weak pelvic floor muscles after prostate gland removal or surgery (males). Kegel exercises involve squeezing your pelvic floor muscles, which are the same muscles you squeeze when you try to stop the flow of urine or keep from passing gas. The exercises can be done while sitting, standing, or lying down, but it is best to vary your position. Exercises How to do Kegel exercises: 1. Squeeze your pelvic floor muscles tight. You should feel a tight lift in your rectal area. If you are a female, you should also feel a tightness  in your vaginal area. Keep your stomach, buttocks, and legs relaxed. 2. Hold the muscles tight for up to 10 seconds. 3. Breathe normally. 4. Relax your muscles. 5. Repeat as told by your health care provider. Repeat this exercise daily as told by your health care provider. Continue to do this exercise for at least 4-6 weeks, or for as long as told by your health care provider. You may be referred to a physical therapist who can help you learn more about how to do Kegel exercises. Depending on your condition, your health care provider may recommend:  Varying how long you squeeze your muscles.  Doing several sets of exercises every day.  Doing exercises for several weeks.  Making Kegel exercises a part of your regular exercise routine. This information is not intended to replace advice given to you by your health care provider. Make sure you discuss any questions you have with your health care provider. Document Revised: 06/13/2018 Document Reviewed: 06/13/2018 Elsevier Patient Education  [place].

## 2020-06-30 LAB — URINALYSIS, MICROSCOPIC ONLY
Bacteria, UA: NONE SEEN
Casts: NONE SEEN /lpf
Epithelial Cells (non renal): NONE SEEN /hpf (ref 0–10)
RBC, Urine: NONE SEEN /hpf (ref 0–2)
WBC, UA: NONE SEEN /hpf (ref 0–5)

## 2020-06-30 LAB — CYTOLOGY - PAP
Comment: NEGATIVE
Diagnosis: NEGATIVE
High risk HPV: NEGATIVE

## 2020-07-01 LAB — URINE CULTURE

## 2020-07-03 ENCOUNTER — Ambulatory Visit: Payer: 59 | Admitting: Obstetrics and Gynecology

## 2020-12-22 ENCOUNTER — Other Ambulatory Visit: Payer: Self-pay

## 2020-12-22 ENCOUNTER — Encounter: Payer: Self-pay | Admitting: Family

## 2020-12-22 ENCOUNTER — Ambulatory Visit (INDEPENDENT_AMBULATORY_CARE_PROVIDER_SITE_OTHER): Payer: 59 | Admitting: Family

## 2020-12-22 VITALS — BP 122/80 | HR 96 | Temp 97.9°F | Resp 16 | Ht 61.0 in | Wt 172.4 lb

## 2020-12-22 DIAGNOSIS — E039 Hypothyroidism, unspecified: Secondary | ICD-10-CM

## 2020-12-22 DIAGNOSIS — E669 Obesity, unspecified: Secondary | ICD-10-CM

## 2020-12-22 DIAGNOSIS — R739 Hyperglycemia, unspecified: Secondary | ICD-10-CM | POA: Diagnosis not present

## 2020-12-22 DIAGNOSIS — Z0000 Encounter for general adult medical examination without abnormal findings: Secondary | ICD-10-CM

## 2020-12-22 LAB — COMPREHENSIVE METABOLIC PANEL
ALT: 9 U/L (ref 0–35)
AST: 7 U/L (ref 0–37)
Albumin: 3.9 g/dL (ref 3.5–5.2)
Alkaline Phosphatase: 80 U/L (ref 39–117)
BUN: 17 mg/dL (ref 6–23)
CO2: 30 mEq/L (ref 19–32)
Calcium: 9 mg/dL (ref 8.4–10.5)
Chloride: 106 mEq/L (ref 96–112)
Creatinine, Ser: 0.58 mg/dL (ref 0.40–1.20)
GFR: 97.87 mL/min (ref >60.00)
Glucose, Bld: 94 mg/dL (ref 70–99)
Potassium: 4.1 mEq/L (ref 3.5–5.1)
Sodium: 142 mEq/L (ref 135–145)
Total Bilirubin: 0.5 mg/dL (ref 0.2–1.2)
Total Protein: 7 g/dL (ref 6.0–8.3)

## 2020-12-22 LAB — LIPID PANEL
Cholesterol: 215 mg/dL — ABNORMAL HIGH (ref 0–200)
HDL: 96.5 mg/dL (ref >39.00)
LDL Cholesterol: 107 mg/dL — ABNORMAL HIGH (ref 0–99)
NonHDL: 118.08
Total CHOL/HDL Ratio: 2
Triglycerides: 53 mg/dL (ref 0.0–149.0)
VLDL: 10.6 mg/dL (ref 0.0–40.0)

## 2020-12-22 LAB — TSH: TSH: 2.13 u[IU]/mL (ref 0.35–4.50)

## 2020-12-22 NOTE — Patient Instructions (Addendum)
[facility]  [phone] Work on healthy diet, exercise and weight loss.

## 2020-12-22 NOTE — Progress Notes (Signed)
Subjective:    Patient ID: Traci [name], female    DOB: Mar 12, 1961, 60 y.o.   MRN: [id number]  HPI  Patient is a 60 yr old female who presents today for cpx.  Immunizations:  Up to date Diet: typical diet is below:  Breakfast- carbs (bagel or egg mcmuffin) occasional homemade oatmeal, yogurt occasionally.  Berniece Salines and Eggs on the weekend Sometimes no breakfast She has herbal tea  Lunch- husband is bringing "faster foods"  Dinner-  Leftovers   Some evening snacking or snacking in the office  Wt Readings from Last 3 Encounters:  12/22/20 172 lb 6.4 oz (78.2 kg)  06/29/20 160 lb 3.2 oz (72.7 kg)  12/17/19 157 lb (71.2 kg)   Exercise: not exercising Colonoscopy: 07/31/15 Dexa:  2019 Pap Smear:  06/29/20 Mammogram: scheduled     Review of Systems  Constitutional: Positive for unexpected weight change.  HENT: Negative for hearing loss and rhinorrhea.   Eyes: Negative for visual disturbance.  Respiratory: Negative for cough and shortness of breath.   Cardiovascular: Negative for chest pain.  Gastrointestinal: Negative for diarrhea, nausea and vomiting.  Genitourinary: Negative for hematuria.  Musculoskeletal: Negative for arthralgias and myalgias.       Occasional plantar fasciitis  Skin: Negative for rash.  Neurological: Negative for headaches.  Hematological: Negative for adenopathy.  Psychiatric/Behavioral:       Denies depression/anxiety- has had a lot of stress.    Past Medical History:  Diagnosis Date  . 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 Allergy   . 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 S/P laparoscopic assisted vaginal hysterectomy (LAVH) 12/04/12   Robotic and bilateral salpingectomy (ovaries remain)  . 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 UTI (lower urinary tract infection)    current with antibiotics     Social History    Socioeconomic History  . Marital status: Married    Spouse name: Legrand Como  . Number of children: Not on file  . Years of education: Not on file  . Highest education level: Not on file  Occupational History  . 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 Smoking status: Never Smoker  . Smokeless tobacco: Never Used  Substance and Sexual Activity  . Alcohol use: Yes    Alcohol/week: 0.0 - 1.0 standard drinks  . Drug use: No  . Sexual activity: Not Currently    Partners: Male    Birth control/protection: Surgical    Comment: hysterectomy  Other Topics Concern  . Not on file  Social History Narrative   2 children   Second marriage   Husband is living in [place]   Works at a [employer] Strain: Not on [employer] Insecurity: Not on file  Transportation Needs: Not on file  Physical Activity: Not on file  Stress: Not on file  Social Connections: Not on file  Intimate Partner Violence: Not on file    Past Surgical History:  Procedure Laterality Date  . CEREBRAL ANEURYSM REPAIR  5/11   [facility]-released from Neurologist. Maintained on Keppra-no seizure activity since surgery  . CHOLECYSTECTOMY  03/2007   lap chole  . CYSTOSCOPY  12/04/2012   Procedure: CYSTOSCOPY;  Surgeon: Peri Maris, MD;  Location:  [facility];  Service: Gynecology;  Laterality: N/A;  . ROBOTIC ASSISTED TOTAL HYSTERECTOMY  12/04/2012   Procedure: ROBOTIC ASSISTED TOTAL HYSTERECTOMY;  Surgeon: Peri Maris, MD;  Location: [facility];  Service: Gynecology;  Laterality: Bilateral;  with bilateral salpingectomy    Family History  Problem Relation Age of Onset  . Osteoarthritis Mother   . Osteoporosis Mother   . Hypothyroidism Mother   . Hashimoto's thyroiditis Mother   . Abdominal Wall Hernia Father   . Benign prostatic hyperplasia Father   . Atrial fibrillation Father   . Heart failure  Maternal Grandfather   . Heart attack Maternal Grandfather   . Cancer Paternal Grandfather        colon cancer  . Stroke Paternal Grandmother   . Hashimoto's thyroiditis Brother     Allergies  Allergen Reactions  . Pollen Extract Other (See Comments)    NASAL CONGESTION, SINUS DRAINAGE  . Penicillins     childhood  . Poison Ivy Extract [Poison Ivy Extract] Hives and Rash    No current outpatient medications on file prior to visit.   No current facility-administered medications on file prior to visit.    BP 122/80 (BP Location: Right Arm, Patient Position: Sitting, Cuff Size: Small)   Pulse 96   Temp 97.9 F (36.6 C) (Temporal)   Resp 16   Ht 5\' 1"  (1.549 m)   Wt 172 lb 6.4 oz (78.2 kg)   LMP 11/29/2012 (Exact Date)   SpO2 99%   BMI 32.57 kg/m        Objective:   Physical Exam  Physical Exam  Constitutional: She is oriented to person, place, and time. She appears well-developed and well-nourished. No distress.  HENT:  Head: Normocephalic and atraumatic.  Right Ear: Tympanic membrane and ear canal normal.  Left Ear: Tympanic membrane and ear canal normal.  Mouth/Throat: Note examined- pt wearing mask Eyes: Pupils are equal, round, and reactive to light. No scleral icterus.  Neck: Normal range of motion. No thyromegaly present.  Cardiovascular: Normal rate and regular rhythm.   No murmur heard. Pulmonary/Chest: Effort normal and breath sounds normal. No respiratory distress. He has no wheezes. She has no rales. She exhibits no tenderness.  Abdominal: Soft. Bowel sounds are normal. She exhibits no distension and no mass. There is no tenderness. There is no rebound and no guarding.  Musculoskeletal: She exhibits no edema.  Lymphadenopathy:    She has no cervical adenopathy.  Neurological: She is alert and oriented to person, place, and time. She has normal patellar reflexes. She exhibits normal muscle tone. Coordination normal.  Skin: Skin is warm and dry.   Psychiatric: She has a normal mood and affect. Her behavior is normal. Judgment and thought content normal.  Breast/Pelvic: deferred        Assessment & Plan:    Preventative care- discussed diet, exercise, weight loss strategies. Pap up to date, mammo scheduled.  Colonoscopy up to date. Labs as ordered. Immunizations reviewed and up to date.  This visit occurred during the SARS-CoV-2 public health emergency.  Safety protocols were in place, including screening questions prior to the visit, additional usage of staff PPE, and extensive cleaning of exam room while observing appropriate contact time as indicated for disinfecting solutions.         Assessment & Plan:

## 2020-12-23 ENCOUNTER — Encounter: Payer: Self-pay | Admitting: Family

## 2020-12-23 NOTE — Progress Notes (Signed)
Mailed out to pt 

## 2020-12-26 ENCOUNTER — Other Ambulatory Visit: Payer: Self-pay

## 2020-12-26 ENCOUNTER — Ambulatory Visit (HOSPITAL_BASED_OUTPATIENT_CLINIC_OR_DEPARTMENT_OTHER)
Admission: RE | Admit: 2020-12-26 | Discharge: 2020-12-26 | Disposition: A | Discharge status: Home / Self Care | Payer: 59 | Source: Ambulatory Visit | Attending: Family | Admitting: Family

## 2020-12-26 DIAGNOSIS — Z1231 Encounter for screening mammogram for malignant neoplasm of breast: Secondary | ICD-10-CM | POA: Insufficient documentation

## 2020-12-26 DIAGNOSIS — Z0000 Encounter for general adult medical examination without abnormal findings: Secondary | ICD-10-CM

## 2021-07-01 NOTE — Progress Notes (Signed)
60 y.o. G28P2002 Married White or Caucasian Not Hispanic or Latino female here for annual exam.  H/O hysterectomy/bilateral salpingectomies. Not currently sexually active. Husband has gained a lot of weight, starting to exercise.    H/O mixed incontinence. Leaking daily.   H/O HPV effect noted on a lesion during colonoscopy, no high grade changes. Last colonoscopy was in 9/16 with a polyp. F/U in 10 years  She has gained 15 lbs in the last year, now with a sedentary job. She has joined the [employer] and is exercising.   Going to [place] over Thanksgiving for 10 days.   Patient's last menstrual period was 11/29/2012 (exact date).          Sexually active: No.  The current method of family planning is status post hysterectomy.    Exercising: Yes.     Goes to the [facility] Smoker:  no  Health Maintenance: Anal Pap:  06-29-20 neg HPV HR neg Vaginal Pap: negative, negative HPV History of abnormal Pap:  yes, abnormal smear of anus HPV 8/14. No h/o cervical dysplasia.  MMG:  12-26-20 category b density birads 1:neg BMD:   2019 normal Colonoscopy: 2016 normal f/u 88yr TDaP:  2014 Gardasil: n/a   reports that she has never smoked. She has never used smokeless tobacco. She reports current alcohol use. She reports that she does not use drugs. Just occasional ETOH. She switched jobs, working with her friend for a company that [employer] sitting. [place] in [place] [place]. 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 Maintained on Keppra-no seizure activity since surgery   CHOLECYSTECTOMY  03/2007   lap chole   CYSTOSCOPY  12/04/2012   Procedure: CYSTOSCOPY;  Surgeon: CPeri Maris MD;  [facility];  Service: Gynecology;  Laterality: N/A;   ROBOTIC ASSISTED TOTAL HYSTERECTOMY  12/04/2012   Procedure: ROBOTIC ASSISTED TOTAL HYSTERECTOMY;  Surgeon: CPeri Maris MD;  [facility];  Service: Gynecology;  Laterality: Bilateral;  with bilateral salpingectomy    No current outpatient medications on file.   No current facility-administered medications for this visit.    Family History  Problem Relation Age of Onset   Osteoarthritis Mother    Osteoporosis Mother    Hypothyroidism Mother    Hashimoto's thyroiditis Mother    Abdominal Wall Hernia Father    Benign prostatic hyperplasia Father    Atrial fibrillation Father    Heart failure Maternal Grandfather    Heart attack Maternal Grandfather    Cancer Paternal Grandfather        colon cancer   Stroke Paternal Grandmother    Hashimoto's thyroiditis Brother     Review of Systems  All other systems reviewed and are negative.  Exam:   BP 110/64   Pulse 66   Ht '5\' 1"'$  (1.549 m)  Wt 175 lb (79.4 kg)   LMP 11/29/2012 (Exact Date)   SpO2 100%   BMI 33.07 kg/m   Weight change: '@WEIGHTCHANGE'$ @ Height:   Height: '5\' 1"'$  (154.9 cm)  Ht Readings from Last 3 Encounters:  07/07/21 '5\' 1"'$  (1.549 m)  12/22/20 '5\' 1"'$  (1.549 m)  06/29/20 5' 0.75" (1.543 m)    General appearance: alert, cooperative and appears stated age Head: Normocephalic, without obvious abnormality, atraumatic Neck: no adenopathy, supple, symmetrical, trachea midline and thyroid normal to inspection and palpation Lungs: clear to auscultation bilaterally Cardiovascular: regular rate and rhythm Breasts: normal appearance, no masses  or tenderness Abdomen: soft, non-tender; non distended,  no masses,  no organomegaly Extremities: extremities normal, atraumatic, no cyanosis or edema Skin: Skin color, texture, turgor normal. No rashes or lesions Lymph nodes: Cervical, supraclavicular, and axillary nodes normal. No abnormal inguinal nodes palpated Neurologic: Grossly normal   Pelvic: External genitalia:  no lesions              Urethra:  normal appearing urethra with no masses, tenderness or lesions              Bartholins and Skenes: normal                 Vagina:atrophic appearing vagina with normal color and discharge, no lesions. No significant prolpase              Cervix: absent               Bimanual Exam:  Uterus:  uterus absent              Adnexa: no mass, fullness, tenderness               Rectovaginal: Confirms               Anus:  normal sphincter tone, no lesions  Gae Dry chaperoned for the exam.  1. Well woman exam Mammogram and colonoscopy are UTD Discussed breast self exam Discussed calcium and vit D intake No pap needed Labs with primary  2. Mixed incontinence Stable - Ambulatory referral to Physical Therapy

## 2021-07-07 ENCOUNTER — Encounter: Payer: Self-pay | Admitting: Obstetrics and Gynecology

## 2021-07-07 ENCOUNTER — Ambulatory Visit (INDEPENDENT_AMBULATORY_CARE_PROVIDER_SITE_OTHER): Payer: 59 | Admitting: Obstetrics and Gynecology

## 2021-07-07 ENCOUNTER — Telehealth: Payer: Self-pay | Admitting: *Deleted

## 2021-07-07 ENCOUNTER — Other Ambulatory Visit: Payer: Self-pay

## 2021-07-07 VITALS — BP 110/64 | HR 66 | Ht 61.0 in | Wt 175.0 lb

## 2021-07-07 DIAGNOSIS — Z01419 Encounter for gynecological examination (general) (routine) without abnormal findings: Secondary | ICD-10-CM | POA: Diagnosis not present

## 2021-07-07 DIAGNOSIS — N3946 Mixed incontinence: Secondary | ICD-10-CM

## 2021-07-07 NOTE — Telephone Encounter (Signed)
Office notes fax[facility] to [facility] they will call to schedule.

## 2021-07-07 NOTE — Telephone Encounter (Signed)
-----   Message from Salvadore Dom, MD sent at 07/07/2021  9:30 AM EDT ----- Referral to [facility] PT for incontinence.

## 2021-07-07 NOTE — Patient Instructions (Addendum)

## 2021-07-13 ENCOUNTER — Other Ambulatory Visit: Payer: Self-pay

## 2021-07-13 ENCOUNTER — Ambulatory Visit (INDEPENDENT_AMBULATORY_CARE_PROVIDER_SITE_OTHER): Payer: 59 | Admitting: Family

## 2021-07-13 ENCOUNTER — Encounter: Payer: Self-pay | Admitting: Family

## 2021-07-13 VITALS — BP 110/70 | HR 108 | Temp 98.8°F | Ht 61.0 in | Wt 176.8 lb

## 2021-07-13 DIAGNOSIS — L309 Dermatitis, unspecified: Secondary | ICD-10-CM | POA: Diagnosis not present

## 2021-07-13 MED ORDER — FAMOTIDINE 20 MG PO TABS: 20.0000 mg | ORAL_TABLET | Freq: Two times a day (BID) | ORAL | 0 refills | Status: DC

## 2021-07-13 MED ORDER — HYDROXYZINE PAMOATE 25 MG PO CAPS: 25.0000 mg | ORAL_CAPSULE | Freq: Three times a day (TID) | ORAL | 0 refills | Status: DC | PRN

## 2021-07-13 MED ORDER — METHYLPREDNISOLONE ACETATE 40 MG/ML IJ SUSP
40.0000 mg | Freq: Once | INTRAMUSCULAR | Status: AC
  Administered 2021-07-13: 40 mg via INTRAMUSCULAR

## 2021-07-13 NOTE — Progress Notes (Signed)
Traci [name] is a 60 y.o. 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     famotidine (PEPCID) 20 MG tablet Take 1 tablet (20 mg total) by mouth 2 (two) times daily. 20 tablet 0   hydrOXYzine (VISTARIL) 25 MG capsule Take 1 capsule (25 mg total) by mouth every 8 (eight) hours as needed. 30 capsule 0   loratadine (CLARITIN) 10 MG tablet Take 10 mg by mouth daily.     triamcinolone cream (KENALOG) 0.5 % APPLY TO AFFECTED AREA TWICE A DAY FOR 7 DAYS     No current facility-administered medications for this visit.    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 Maintained on Keppra-no seizure activity since surgery   CHOLECYSTECTOMY  03/2007   lap chole   CYSTOSCOPY  12/04/2012   Procedure: CYSTOSCOPY;  Surgeon:  Peri Maris, MD;  Location: [facility];  Service: Gynecology;  Laterality: N/A;   ROBOTIC ASSISTED TOTAL HYSTERECTOMY  12/04/2012   Procedure: ROBOTIC ASSISTED TOTAL HYSTERECTOMY;  Surgeon: Peri Maris, MD;  Location: [facility];  Service: Gynecology;  Laterality: Bilateral;  with bilateral salpingectomy    Family History  Problem Relation Age of Onset   Osteoarthritis Mother    Osteoporosis Mother    Hypothyroidism Mother    Hashimoto's thyroiditis Mother    Abdominal Wall Hernia Father    Benign prostatic hyperplasia Father    Atrial fibrillation Father    Heart failure Maternal Grandfather    Heart attack Maternal Grandfather    Cancer Paternal Grandfather        colon cancer   Stroke Paternal Grandmother    Hashimoto's thyroiditis Brother     Social History   Tobacco Use   Smoking status: Never   Smokeless tobacco: Never  Substance Use Topics   Alcohol use: Yes    Alcohol/week: 0.0 - 1.0 standard drinks    Subjective:  2 week history of rash- started in left antecubetal space and has spread to upper shoulder; has been to U/C 2 days ago with limited response to topical triamcinolone cream;  Denies any new soaps, foods, detergents or medications.  Currently taking Claritin and Benadryl for anti-histamine benefit; itching is problematic at night;     Objective:  Vitals:   07/13/21 1321  BP: 110/70  Pulse: (!) 108  Temp:  98.8 F (37.1 C)  TempSrc: Oral  SpO2: 99%  Weight: 176 lb 12.8 oz (80.2 kg)  Height: '5\' 1"'$  (1.549 m)    General: Well developed, well nourished, in no acute distress  Skin : Warm and dry. Erythematous rash noted in left vascular space; left upper papular rash noted on left upper shoulder;  Head: Normocephalic and atraumatic  Eyes: Sclera and conjunctiva clear; pupils round and reactive to light; extraocular movements intact  Ears: External normal; canals clear; tympanic membranes normal  Oropharynx: Pink, supple. No suspicious lesions  Neck:  Supple without thyromegaly, adenopathy  Lungs: Respirations unlabored;  Neurologic: Alert and oriented; speech intact; face symmetrical; moves all extremities well; CNII-XII intact without focal deficit   Assessment:  1. Dermatitis     Plan:  ? Etiology; Depo-Medrol IM 40 mg given; change to Hydroxyzine and Pepcid to help with rash/ itching; if symptoms persist, will need to refer to dermatology;   This visit occurred during the SARS-CoV-2 public health emergency.  Safety protocols were in place, including screening questions prior to the visit, additional usage of staff PPE, and extensive cleaning of exam room while observing appropriate contact time as indicated for disinfecting solutions.    No follow-ups on file.  No orders of the defined types were placed in this encounter.   Requested Prescriptions   Signed Prescriptions Disp Refills   hydrOXYzine (VISTARIL) 25 MG capsule 30 capsule 0    Sig: Take 1 capsule (25 mg total) by mouth every 8 (eight) hours as needed.   famotidine (PEPCID) 20 MG tablet 20 tablet 0    Sig: Take 1 tablet (20 mg total) by mouth 2 (two) times daily.

## 2021-07-13 NOTE — Patient Instructions (Signed)
If you cannot tolerate the Hydroxyzine during the day, please continue Claritin during the day and take Hydroxyzine at night; the Pepcid (famotidine) is a twice a day medication.

## 2021-07-13 NOTE — Progress Notes (Deleted)
left

## 2021-07-23 NOTE — Telephone Encounter (Signed)
I called patient because [facility] has not heard back from her regarding referral . Patient said she would give them a call to schedule.

## 2021-07-30 ENCOUNTER — Telehealth: Payer: Self-pay | Admitting: Family

## 2021-07-30 MED ORDER — MOLNUPIRAVIR 200 MG PO CAPS: 4.0000 | ORAL_CAPSULE | Freq: Two times a day (BID) | ORAL | 0 refills | Status: AC

## 2021-07-30 NOTE — Telephone Encounter (Signed)
Spoke w/ Pt- informed rx has been [facility]. Informed to keep appt Monday.

## 2021-07-30 NOTE — Telephone Encounter (Signed)
[facility] sent to [facility] for molnupiravir.

## 2021-07-30 NOTE — Telephone Encounter (Signed)
Pt tested positive for covid today 9/23. We sched a MyChart vv for 9/26. Pt would like for meds to be prescribed to her today. Please advise.

## 2021-07-30 NOTE — Telephone Encounter (Signed)
Please advise 

## 2021-08-02 ENCOUNTER — Telehealth (INDEPENDENT_AMBULATORY_CARE_PROVIDER_SITE_OTHER): Payer: 59 | Admitting: Family

## 2021-08-02 ENCOUNTER — Other Ambulatory Visit: Payer: Self-pay

## 2021-08-02 DIAGNOSIS — U071 COVID-19: Secondary | ICD-10-CM | POA: Diagnosis not present

## 2021-08-02 MED ORDER — LIDOCAINE VISCOUS HCL 2 % MT SOLN: OROMUCOSAL | 0 refills | Status: DC

## 2021-08-02 NOTE — Progress Notes (Signed)
Virtual telephone visit    Virtual Visit via Telephone Note   This visit type was conducted due to national recommendations for restrictions regarding the COVID-19 Pandemic (e.g. social distancing) in an effort to limit this patient's exposure and mitigate transmission in our community. Due to her co-morbid illnesses, this patient is at least at moderate risk for complications without adequate follow up. This format is felt to be most appropriate for this patient at this time. The patient did not have access to video technology or had technical difficulties with video requiring transitioning to audio format only (telephone). Physical exam was limited to content and character of the telephone converstion. CMA was able to get the patient set up on a telephone visit.   Patient location: Home Patient and provider in visit Provider location: Office  I discussed the limitations of evaluation and management by telemedicine and the availability of in person appointments. The patient expressed understanding and agreed to proceed.   Visit Date: 08/02/2021  Today's healthcare provider: Nance Pear, NP     Subjective:    Patient ID: Traci [name], female    DOB: 05/22/1961, 60 y.o.   MRN: [id number]  Chief Complaint  Patient presents with   Covid Positive    Patient reports she tested positive for covid 07-30-21   Covid Symptoms    Symptoms started Friday morning with fatigue.    Sore Throat    Complains of sore throat    Sore Throat  Associated symptoms include coughing.  Patient is in today for a video visit.  She complains of post nasal drip, rhinorrhea, sore throat since last Thursday. She is coughing to clear mucous. She tested positive for Covid-19 on Friday, 07/30/2021. She was prescribed molnupiravir t9/23 to manage her symptoms and finds relief.  Her sore throat continues to bother her. She is ibuprofen and nyquil at night to manage her symptoms and finds mild relief.  She is requesting medication to manage her pain.   Immunization- She is interested in getting the new Covid-19 booster vaccine after she recovers from [place].    Past Medical History:  Diagnosis Date   Abnormal cytologic smear of anus with positive test for anal human papillomavirus (HPV) 8/14   vaginal/ vulvar Colpo without biopsy -no lesions   Allergy    Aneurysm ([place]) 03/26/2011   brain   S/P laparoscopic assisted vaginal hysterectomy (LAVH) 12/04/12   Robotic and bilateral salpingectomy (ovaries remain)   Seizures ([place]) 2011   post cerebral repair    UTI (lower urinary tract infection)    current with antibiotics    Past Surgical History:  Procedure Laterality Date   CEREBRAL ANEURYSM REPAIR  5/11   [facility]-released from Neurologist. Maintained on Keppra-no seizure activity since surgery   CHOLECYSTECTOMY  03/2007   lap chole   CYSTOSCOPY  12/04/2012   Procedure: CYSTOSCOPY;  Surgeon: Peri Maris, MD;  Location: [facility];  Service: Gynecology;  Laterality: N/A;   ROBOTIC ASSISTED TOTAL HYSTERECTOMY  12/04/2012   Procedure: ROBOTIC ASSISTED TOTAL HYSTERECTOMY;  Surgeon: Peri Maris, MD;  Location: [facility];  Service: Gynecology;  Laterality: Bilateral;  with bilateral salpingectomy    Family History  Problem Relation Age of Onset   Osteoarthritis Mother    Osteoporosis Mother    Hypothyroidism Mother    Hashimoto's thyroiditis Mother    Abdominal Wall Hernia Father    Benign prostatic hyperplasia Father    Atrial fibrillation Father    Heart  failure Maternal Grandfather    Heart attack Maternal Grandfather    Cancer Paternal Grandfather        colon cancer   Stroke Paternal Grandmother    Hashimoto's thyroiditis Brother     Social History   Socioeconomic History   Marital status: Married    Spouse name: Legrand Como   Number of children: Not on file   Years of education: Not on file   Highest education level: Not on file  Occupational History    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: [employer]  Tobacco Use   Smoking status: Never   Smokeless tobacco: Never  Substance and Sexual Activity   Alcohol use: Yes    Alcohol/week: 0.0 - 1.0 standard drinks   Drug use: No   Sexual activity: Not Currently    Partners: Male    Birth control/protection: Surgical    Comment: hysterectomy  Other Topics Concern   Not on file  Social History Narrative   2 children   Second marriage   Husband is living in [place]   Works at [employer] store   Social Determinants of Radio broadcast assistant Strain: Not on [employer] Insecurity: Not on file  Transportation Needs: Not on file  Physical Activity: Not on file  Stress: Not on file  Social Connections: Not on file  Intimate Partner Violence: Not on file    Outpatient Medications Prior to Visit  Medication Sig Dispense Refill   diphenhydrAMINE (BENADRYL) 25 MG tablet Take 25 mg by mouth every 6 (six) hours as needed.     famotidine (PEPCID) 20 MG tablet Take 1 tablet (20 mg total) by mouth 2 (two) times daily. 20 tablet 0   hydrOXYzine (VISTARIL) 25 MG capsule Take 1 capsule (25 mg total) by mouth every 8 (eight) hours as needed. 30 capsule 0   loratadine (CLARITIN) 10 MG tablet Take 10 mg by mouth daily.     molnupiravir EUA (LAGEVRIO) 200 MG CAPS capsule Take 4 capsules (800 mg total) by mouth in the morning and at bedtime for 5 days. 40 capsule 0   triamcinolone cream (KENALOG) 0.5 % APPLY TO AFFECTED AREA TWICE A DAY FOR 7 DAYS     No facility-administered medications prior to visit.    Allergies  Allergen Reactions   Pollen Extract Other (See Comments)    NASAL CONGESTION, SINUS DRAINAGE   Penicillins     childhood   Poison Ivy Extract [Poison Ivy Extract] Hives and Rash    Review of Systems  HENT:  Positive for sore throat.        (+)post-nasal drip (+)rhinorrhea  Respiratory:  Positive for cough.       Objective:    Physical  Exam HENT:     Head:     Comments: Hoarse voice Pulmonary:     Effort: Pulmonary effort is normal.  Neurological:     Mental Status: She is alert and oriented to person, place, and time.  Psychiatric:        Mood and Affect: Mood normal.        Behavior: Behavior normal.        Thought Content: Thought content normal.        Judgment: Judgment normal.    LMP 11/29/2012 (Exact Date)  Wt Readings from Last 3 Encounters:  07/13/21 176 lb 12.8 oz (80.2 kg)  07/07/21 175 lb (79.4 kg)  12/22/20 172 lb 6.4 oz (78.2  kg)    Diabetic Foot Exam - Simple   No data filed    Lab Results  Component Value Date   WBC 5.6 12/17/2019   HGB 13.0 12/17/2019   HCT 39.6 12/17/2019   PLT 256.0 12/17/2019   GLUCOSE 94 12/22/2020   CHOL 215 (H) 12/22/2020   TRIG 53.0 12/22/2020   HDL 96.50 12/22/2020   LDLCALC 107 (H) 12/22/2020   ALT 9 12/22/2020   AST 7 12/22/2020   NA 142 12/22/2020   K 4.1 12/22/2020   CL 106 12/22/2020   CREATININE 0.58 12/22/2020   BUN 17 12/22/2020   CO2 30 12/22/2020   TSH 2.13 12/22/2020   INR 1.05 03/24/2010    Lab Results  Component Value Date   TSH 2.13 12/22/2020   Lab Results  Component Value Date   WBC 5.6 12/17/2019   HGB 13.0 12/17/2019   HCT 39.6 12/17/2019   MCV 86.5 12/17/2019   PLT 256.0 12/17/2019   Lab Results  Component Value Date   NA 142 12/22/2020   K 4.1 12/22/2020   CO2 30 12/22/2020   GLUCOSE 94 12/22/2020   BUN 17 12/22/2020   CREATININE 0.58 12/22/2020   BILITOT 0.5 12/22/2020   ALKPHOS 80 12/22/2020   AST 7 12/22/2020   ALT 9 12/22/2020   PROT 7.0 12/22/2020   ALBUMIN 3.9 12/22/2020   CALCIUM 9.0 12/22/2020   GFR 97.87 12/22/2020   Lab Results  Component Value Date   CHOL 215 (H) 12/22/2020   Lab Results  Component Value Date   HDL 96.50 12/22/2020   Lab Results  Component Value Date   LDLCALC 107 (H) 12/22/2020   Lab Results  Component Value Date   TRIG 53.0 12/22/2020   Lab Results  Component  Value Date   CHOLHDL 2 12/22/2020   No results found for: HGBA1C     Assessment & Plan:   Problem List Items Addressed This Visit       Unprioritized   COVID-19 virus infection - Primary    Will continue molnupiravir 800mg  BID x 5 days total. For sore throat, recommended ibuprofen and prn viscous lidocaine. Advised of CDC guidelines for self isolation/ ending isolation.  Advised of safe practice guidelines. Symptom Tier reviewed.  Encouraged to monitor for any worsening symptoms; watch for increased shortness of breath, weakness, and signs of dehydration. Advised when to seek emergency care.  Instructed to rest and hydrate well.  Advised to leave the house during recommended isolation period, only if it is necessary to seek medical care         Meds ordered this encounter  Medications   lidocaine (XYLOCAINE) 2 % solution    Sig: 15 ML by mouth every 6 hours as needed for throat pain.    Dispense:  200 mL    Refill:  0    Order Specific Question:   Supervising Provider    Answer:   Penni Homans A [[id number]]     I discussed the assessment and treatment plan with the patient. The patient was provided an opportunity to ask questions and all were answered. The patient agreed with the plan and demonstrated an understanding of the instructions.   The patient was advised to call back or seek an in-person evaluation if the symptoms worsen or if the condition fails to improve as anticipated.  I,Shehryar Baig,acting as a Education administrator for [employer], NP.,have documented all relevant documentation on the behalf of Nance Pear, NP,as directed  by  Nance Pear, NP while in the presence of Nance Pear, NP.  I provided 15 minutes of non-face-to-face time during this encounter.   Nance Pear, NP [employer] [facility] [employer] [facility] [phone] (phone) [phone] (fax)  [place]

## 2021-08-03 DIAGNOSIS — U071 COVID-19: Secondary | ICD-10-CM

## 2021-08-03 HISTORY — DX: COVID-19: U07.1

## 2021-08-03 NOTE — Assessment & Plan Note (Signed)
Will continue molnupiravir 800mg  BID x 5 days total. For sore throat, recommended ibuprofen and prn viscous lidocaine. Advised of CDC guidelines for self isolation/ ending isolation.  Advised of safe practice guidelines. Symptom Tier reviewed.  Encouraged to monitor for any worsening symptoms; watch for increased shortness of breath, weakness, and signs of dehydration. Advised when to seek emergency care.  Instructed to rest and hydrate well.  Advised to leave the house during recommended isolation period, only if it is necessary to seek medical care

## 2021-08-04 NOTE — Telephone Encounter (Signed)
[facility] message left on voicemail 07/14/21 to call to schedule. [facility] also mailed a card to patient address.   Encounter closed as patient has been notified by urology.

## 2021-12-24 ENCOUNTER — Other Ambulatory Visit (HOSPITAL_BASED_OUTPATIENT_CLINIC_OR_DEPARTMENT_OTHER): Payer: Self-pay

## 2021-12-24 ENCOUNTER — Encounter: Payer: Self-pay | Admitting: Family

## 2021-12-24 ENCOUNTER — Ambulatory Visit (INDEPENDENT_AMBULATORY_CARE_PROVIDER_SITE_OTHER): Payer: 59 | Admitting: Family

## 2021-12-24 VITALS — BP 128/67 | HR 82 | Temp 98.7°F | Resp 16 | Ht 61.0 in | Wt 180.4 lb

## 2021-12-24 DIAGNOSIS — E785 Hyperlipidemia, unspecified: Secondary | ICD-10-CM

## 2021-12-24 DIAGNOSIS — Z0000 Encounter for general adult medical examination without abnormal findings: Secondary | ICD-10-CM

## 2021-12-24 DIAGNOSIS — R635 Abnormal weight gain: Secondary | ICD-10-CM | POA: Diagnosis not present

## 2021-12-24 DIAGNOSIS — M8580 Other specified disorders of bone density and structure, unspecified site: Secondary | ICD-10-CM

## 2021-12-24 LAB — LIPID PANEL
Cholesterol: 212 mg/dL — ABNORMAL HIGH (ref 0–200)
HDL: 80.9 mg/dL (ref >39.00)
LDL Cholesterol: 112 mg/dL — ABNORMAL HIGH (ref 0–99)
NonHDL: 131.17
Total CHOL/HDL Ratio: 3
Triglycerides: 98 mg/dL (ref 0.0–149.0)
VLDL: 19.6 mg/dL (ref 0.0–40.0)

## 2021-12-24 LAB — COMPREHENSIVE METABOLIC PANEL
ALT: 10 U/L (ref 0–35)
AST: 8 U/L (ref 0–37)
Albumin: 4.1 g/dL (ref 3.5–5.2)
Alkaline Phosphatase: 83 U/L (ref 39–117)
BUN: 13 mg/dL (ref 6–23)
CO2: 34 mEq/L — ABNORMAL HIGH (ref 19–32)
Calcium: 9.1 mg/dL (ref 8.4–10.5)
Chloride: 104 mEq/L (ref 96–112)
Creatinine, Ser: 0.51 mg/dL (ref 0.40–1.20)
GFR: 100.24 mL/min (ref >60.00)
Glucose, Bld: 87 mg/dL (ref 70–99)
Potassium: 4.6 mEq/L (ref 3.5–5.1)
Sodium: 142 mEq/L (ref 135–145)
Total Bilirubin: 0.5 mg/dL (ref 0.2–1.2)
Total Protein: 7 g/dL (ref 6.0–8.3)

## 2021-12-24 LAB — TSH: TSH: 2.79 u[IU]/mL (ref 0.35–5.50)

## 2021-12-24 MED ORDER — FLUARIX QUADRIVALENT 0.5 ML IM SUSY
PREFILLED_SYRINGE | INTRAMUSCULAR | 0 refills | Status: DC
  Filled 2021-12-24: qty 0.5, 1d supply, fill #0

## 2021-12-24 NOTE — Progress Notes (Signed)
Subjective:     Patient ID: Traci [name], female    DOB: [birth year], 61 y.o.   MRN: [id number]  Chief Complaint  Patient presents with   Annual Exam         HPI  Patient is in today for cpx.  Patient presents today for complete physical.  Immunizations: will get flu shot and covid shot at the pharmacy Wt Readings from Last 3 Encounters:  12/24/21 180 lb 6.4 oz (81.8 kg)  07/13/21 176 lb 12.8 oz (80.2 kg)  07/07/21 175 lb (79.4 kg)  Diet: stress eating, upset with the weight gain Exercise: notes that her job is more sedentary. Notes increased stress.  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 Maintained on Keppra-no seizure activity since surgery   CHOLECYSTECTOMY  03/2007   lap chole   CYSTOSCOPY  12/04/2012   Procedure: CYSTOSCOPY;  Surgeon: Peri Maris, MD;  Location: [facility];  Service: Gynecology;  Laterality: N/A;   ROBOTIC ASSISTED TOTAL HYSTERECTOMY  12/04/2012   Procedure: ROBOTIC ASSISTED TOTAL HYSTERECTOMY;  Surgeon: Peri Maris, MD;  Location: [facility];  Service: Gynecology;  Laterality: Bilateral;  with bilateral salpingectomy    Family History  Problem  Relation Age of Onset   Osteoarthritis Mother    Osteoporosis Mother    Hypothyroidism Mother    Hashimoto's thyroiditis Mother    Abdominal Wall Hernia Father    Benign prostatic hyperplasia Father    Atrial fibrillation Father    Heart failure Maternal Grandfather    Heart attack Maternal Grandfather    Cancer Paternal Grandfather        colon cancer   Stroke Paternal Grandmother    Hashimoto's thyroiditis Brother     Social History   Socioeconomic History   Marital status: Married    Spouse name: Legrand Como   Number of children: Not on file   Years of education: Not on file   Highest education level: Not on file  Occupational History   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: [employer]  Tobacco Use   Smoking status: Never   Smokeless tobacco: Never  Substance and Sexual Activity   Alcohol use: Yes    Alcohol/week: 0.0 - 1.0 standard drinks   Drug use: No   Sexual activity: Not Currently    Partners: Male    Birth control/protection: Surgical    Comment: hysterectomy  Other Topics Concern   Not on file  Social History Narrative   2 children   Second marriage   Husband is living in [place]   Works at [employer]  Social Determinants of Health   Financial Resource Strain: Not on file  Food Insecurity: Not on file  Transportation Needs: Not on file  Physical Activity: Not on file  Stress: Not on file  Social Connections: Not on file  Intimate Partner Violence: Not on file    Outpatient Medications Prior to Visit  Medication Sig Dispense Refill   loratadine (CLARITIN) 10 MG tablet Take 10 mg by mouth daily.     triamcinolone cream (KENALOG) 0.5 % APPLY TO AFFECTED AREA TWICE A DAY FOR 7 DAYS     diphenhydrAMINE (BENADRYL) 25 MG tablet Take 25 mg by mouth every 6 (six) hours as needed.     famotidine (PEPCID) 20 MG tablet Take 1 tablet (20 mg total) by mouth 2 (two) times daily. 20 tablet 0   hydrOXYzine (VISTARIL) 25 MG  capsule Take 1 capsule (25 mg total) by mouth every 8 (eight) hours as needed. 30 capsule 0   lidocaine (XYLOCAINE) 2 % solution 15 ML by mouth every 6 hours as needed for throat pain. 200 mL 0   No facility-administered medications prior to visit.    Allergies  Allergen Reactions   Pollen Extract Other (See Comments)    NASAL CONGESTION, SINUS DRAINAGE   Penicillins     childhood   Poison Ivy Extract [Poison Ivy Extract] Hives and Rash    Review of Systems  Constitutional:  Negative for weight loss.  HENT:  Negative for congestion and hearing loss.   Eyes:  Negative for blurred vision.  Respiratory:  Negative for cough.   Cardiovascular:  Negative for chest pain.  Gastrointestinal:  Negative for constipation and diarrhea.  Genitourinary:  Negative for dysuria and urgency.  Musculoskeletal:  Negative for joint pain and myalgias.  Skin:  Positive for rash (some rash on her left forearm, triamcinolone helps).  Neurological:  Positive for headaches (occasional).  Psychiatric/Behavioral:         Denies depression/anxiety, does note stress      Objective:    Physical Exam  BP 128/67 (BP Location: Right Arm, Patient Position: Sitting, Cuff Size: Large)    Pulse 82    Temp 98.7 F (37.1 C) (Oral)    Resp 16    Ht _0  (1.549 m)    Wt 180 lb 6.4 oz (81.8 kg)    LMP 11/29/2012 (Exact Date)    SpO2 100%    BMI 34.09 kg/m  Wt Readings from Last 3 Encounters:  12/24/21 180 lb 6.4 oz (81.8 kg)  07/13/21 176 lb 12.8 oz (80.2 kg)  07/07/21 175 lb (79.4 kg)   Physical Exam  Constitutional: She is oriented to person, place, and time. She appears well-developed and well-nourished. No distress.  HENT:  Head: Normocephalic and atraumatic.  Right Ear: Tympanic membrane and ear canal normal.  Left Ear: Tympanic membrane and ear canal normal.  Mouth/Throat: Not examined- pt wearing mask Eyes: Pupils are equal, round, and reactive to light. No scleral icterus.  Neck: Normal range of  motion. No thyromegaly present.  Cardiovascular: Normal rate and regular rhythm.   No murmur heard. Pulmonary/Chest: Effort normal and breath sounds normal. No respiratory distress. He has no wheezes. She has no rales. She exhibits no tenderness.  Abdominal: Soft. Bowel sounds are normal. She exhibits no distension and no mass. There is no tenderness. There is no rebound and no guarding.  Musculoskeletal: She exhibits no edema.  Lymphadenopathy:    She has no cervical adenopathy.  Neurological: She is alert  and oriented to person, place, and time. She has normal patellar reflexes. She exhibits normal muscle tone. Coordination normal.  Skin: Skin is warm and dry.  Psychiatric: She has a normal mood and affect. Her behavior is normal. Judgment and thought content normal.  Breast/pelvic: deferred         Assessment & Plan:       Assessment & Plan:   Problem List Items Addressed This Visit   None Visit Diagnoses     Preventative health care    -  Primary   Relevant Orders   MM 3D SCREEN BREAST BILATERAL   Osteopenia, unspecified location       Relevant Orders   DG Bone Density   Hyperlipidemia, unspecified hyperlipidemia type       Relevant Orders   Comp Met (CMET)   Lipid panel   Weight gain       Relevant Orders   TSH       I have discontinued Oprah Camarena. Williams "Kim"'s diphenhydrAMINE, hydrOXYzine, famotidine, and lidocaine. I am also having her maintain her triamcinolone cream and loratadine.  No orders of the defined types were placed in this encounter.

## 2021-12-24 NOTE — Patient Instructions (Signed)
Please get flu shot and covid bivalent vaccine at the pharmacy.

## 2021-12-24 NOTE — Assessment & Plan Note (Signed)
Discussed healthy diet and regular exercise and weight loss.  Refer for mammo/dexa.  Pap up to date. She will get flu shot and covid booster at the pharmacy.

## 2021-12-25 ENCOUNTER — Encounter: Payer: Self-pay | Admitting: Family

## 2021-12-28 NOTE — Progress Notes (Signed)
Mailed out to patient 

## 2021-12-30 ENCOUNTER — Encounter (HOSPITAL_BASED_OUTPATIENT_CLINIC_OR_DEPARTMENT_OTHER): Payer: Self-pay

## 2021-12-30 ENCOUNTER — Encounter: Payer: Self-pay | Admitting: Family

## 2021-12-30 ENCOUNTER — Other Ambulatory Visit: Payer: Self-pay

## 2021-12-30 ENCOUNTER — Ambulatory Visit (HOSPITAL_BASED_OUTPATIENT_CLINIC_OR_DEPARTMENT_OTHER)
Admission: RE | Admit: 2021-12-30 | Discharge: 2021-12-30 | Disposition: A | Discharge status: Home / Self Care | Payer: 59 | Source: Ambulatory Visit | Attending: Family | Admitting: Family

## 2021-12-30 DIAGNOSIS — Z0000 Encounter for general adult medical examination without abnormal findings: Secondary | ICD-10-CM | POA: Diagnosis not present

## 2021-12-30 DIAGNOSIS — Z1231 Encounter for screening mammogram for malignant neoplasm of breast: Secondary | ICD-10-CM | POA: Insufficient documentation

## 2021-12-30 DIAGNOSIS — M8580 Other specified disorders of bone density and structure, unspecified site: Secondary | ICD-10-CM | POA: Insufficient documentation

## 2021-12-30 HISTORY — DX: Other specified disorders of bone density and structure, unspecified site: M85.80

## 2021-12-30 NOTE — Progress Notes (Signed)
Mailed out to patient 

## 2022-02-14 ENCOUNTER — Encounter: Payer: Self-pay | Admitting: Family

## 2022-07-14 ENCOUNTER — Ambulatory Visit: Payer: 59 | Admitting: Obstetrics and Gynecology
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
## 2022-07-27 ENCOUNTER — Ambulatory Visit (INDEPENDENT_AMBULATORY_CARE_PROVIDER_SITE_OTHER): Payer: 59 | Admitting: Obstetrics and Gynecology

## 2022-07-27 ENCOUNTER — Encounter: Payer: Self-pay | Admitting: Obstetrics and Gynecology

## 2022-07-27 VITALS — BP 134/82 | HR 78 | Ht 60.25 in | Wt 185.0 lb

## 2022-07-27 DIAGNOSIS — A630 Anogenital (venereal) warts: Secondary | ICD-10-CM | POA: Insufficient documentation

## 2022-07-27 DIAGNOSIS — K5730 Diverticulosis of large intestine without perforation or abscess without bleeding: Secondary | ICD-10-CM | POA: Insufficient documentation

## 2022-07-27 DIAGNOSIS — N3946 Mixed incontinence: Secondary | ICD-10-CM

## 2022-07-27 DIAGNOSIS — Z01419 Encounter for gynecological examination (general) (routine) without abnormal findings: Secondary | ICD-10-CM

## 2022-07-27 DIAGNOSIS — Z800 Family history of malignant neoplasm of digestive organs: Secondary | ICD-10-CM | POA: Insufficient documentation

## 2022-07-27 NOTE — Patient Instructions (Addendum)
CALCIUM AND VITAMIN D:  Adequate intake of calcium and Vitamin D are recommended for bone health.  You should be getting between 1000-1200 mg of calcium and 800 units of Vitamin D daily between diet and supplements  Kegel Exercises  Kegel exercises can help strengthen your pelvic floor muscles. The pelvic floor is a group of muscles that support your rectum, small intestine, and bladder. In females, pelvic floor muscles also help support the uterus. These muscles help you control the flow of urine and stool (feces). Kegel exercises are painless and simple. They do not require any equipment. Your provider may suggest Kegel exercises to: Improve bladder and bowel control. Improve sexual response. Improve weak pelvic floor muscles after surgery to remove the uterus (hysterectomy) or after pregnancy, in females. Improve weak pelvic floor muscles after prostate gland removal or surgery, in males. Kegel exercises involve squeezing your pelvic floor muscles. These are the same muscles you squeeze when you try to stop the flow of urine or keep from passing gas. The exercises can be done while sitting, standing, or lying down, but it is best to vary your position. Ask your health care provider which exercises are safe for you. Do exercises exactly as told by your health care provider and adjust them as directed. Do not begin these exercises until told by your health care provider. Exercises How to do Kegel exercises: Squeeze your pelvic floor muscles tight. You should feel a tight lift in your rectal area. If you are a female, you should also feel a tightness in your vaginal area. Keep your stomach, buttocks, and legs relaxed. Hold the muscles tight for up to 10 seconds. Breathe normally. Relax your muscles for up to 10 seconds. Repeat as told by your health care provider. Repeat this exercise daily as told by your health care provider. Continue to do this exercise for at least 4-6 weeks, or for as long  as told by your health care provider. You may be referred to a physical therapist who can help you learn more about how to do Kegel exercises. Depending on your condition, your health care provider may recommend: Varying how long you squeeze your muscles. Doing several sets of exercises every day. Doing exercises for several weeks. Making Kegel exercises a part of your regular exercise routine. This information is not intended to replace advice given to you by your health care provider. Make sure you discuss any questions you have with your health care provider. Document Revised: 03/04/2021 Document Reviewed: 03/04/2021 Elsevier Patient Education  [place].   Urinary Incontinence Urinary incontinence refers to a condition in which a person is unable to control where and when to pass urine. A person with this condition will urinate involuntarily. This means that the person urinates when he or she does not mean to. What are the causes? This condition may be caused by: Medicines. Infections. Constipation. Overactive bladder muscles. Weak bladder muscles. Weak pelvic floor muscles. These muscles provide support for the bladder, intestine, and, in women, the uterus. Enlarged prostate in men. The prostate is a gland near the bladder. When it gets too big, it can pinch the urethra. With the urethra blocked, the bladder can weaken and lose the ability to empty properly. Surgery. Emotional factors, such as anxiety, stress, or post-traumatic stress disorder (PTSD). Spinal cord injury, nerve injury, or other neurological conditions. Pelvic organ prolapse. This happens in women when organs move out of place and into the vagina. This movement can prevent the bladder  and urethra from working properly. What increases the risk? The following factors may make you more likely to develop this condition: Age. The older you are, the higher the risk. Obesity. Being physically inactive. Pregnancy  and childbirth. Menopause. Diseases that affect the nerves or spinal cord. Long-term, or chronic, coughing. This can increase pressure on the bladder and pelvic floor muscles. What are the signs or symptoms? Symptoms may vary depending on the type of urinary incontinence you have. They include: A sudden urge to urinate, and passing urine involuntarily before you can get to a bathroom (urge incontinence). Suddenly passing urine when doing activities that force urine to pass, such as coughing, laughing, exercising, or sneezing (stress incontinence). Needing to urinate often but urinating only a small amount, or constantly dribbling urine (overflow incontinence). Urinating because you cannot get to the bathroom in time due to a physical disability, such as arthritis or injury, or due to a communication or thinking problem, such as Alzheimer's disease (functional incontinence). How is this diagnosed? This condition may be diagnosed based on: Your medical history. A physical exam. Tests, such as: Urine tests. X-rays of your kidney and bladder. Ultrasound. CT scan. Cystoscopy. In this procedure, a health care provider inserts a tube with a light and camera (cystoscope) through the urethra and into the bladder to check for problems. Urodynamic testing. These tests assess how well the bladder, urethra, and sphincter can store and release urine. There are different types of urodynamic tests, and they vary depending on what the test is measuring. To help diagnose your condition, your health care provider may recommend that you keep a log of when you urinate and how much you urinate. How is this treated? Treatment for this condition depends on the type of incontinence that you have and its cause. Treatment may include: Lifestyle changes, such as: Quitting smoking. Maintaining a healthy weight. Staying active. Try to get 150 minutes of moderate-intensity exercise every week. Ask your health care  provider which activities are safe for you. Eating a healthy diet. Avoid high-fat foods, like fried foods. Avoid refined carbohydrates like white bread and white rice. Limit how much alcohol and caffeine you drink. Increase your fiber intake. Healthy sources of fiber include beans, whole grains, and fresh fruits and vegetables. Behavioral changes, such as: Pelvic floor muscle exercises. Bladder training, such as lengthening the amount of time between bathroom breaks, or using the bathroom at regular intervals. Using techniques to suppress bladder urges. This can include distraction techniques or controlled breathing exercises. Medicines, such as: Medicines to relax the bladder muscles and prevent bladder spasms. Medicines to help slow or prevent the growth of a man's prostate. Botox injections. These can help relax the bladder muscles. Treatments, such as: Using pulses of electricity to help change bladder reflexes (electrical nerve stimulation). For women, using a medical device to prevent urine leaks. This is a small, tampon-like, disposable device that is inserted into the urethra. Injecting collagen or carbon beads (bulking agents) into the urinary sphincter. These can help thicken tissue and close the bladder opening. Surgery. Follow these instructions at home: Lifestyle Limit alcohol and caffeine. These can fill your bladder quickly and irritate it. Keep yourself clean to help prevent odors and skin damage. Ask your health care provider about special skin creams and cleansers that can protect the skin from urine. Consider wearing pads or adult diapers. Make sure to change them regularly, and always change them right after experiencing incontinence. General instructions Take over-the-counter and prescription medicines only  as told by your health care provider. Use the bathroom about every 3-4 hours, even if you do not feel the need to urinate. Try to empty your bladder completely every  time. After urinating, wait a minute. Then try to urinate again. Make sure you are in a relaxed position while urinating. If your incontinence is caused by nerve problems, keep a log of the medicines you take and the times you go to the bathroom. Keep all follow-up visits. This is important. Where to find more information [employer] of Diabetes and Digestive and Kidney Diseases: [employer]Funeral.dk American Urology Association: www.urologyhealth.org Contact a health care provider if: You have pain that gets worse. Your incontinence gets worse. Get help right away if: You have a fever or chills. You are unable to urinate. You have redness in your groin area or down your legs. Summary Urinary incontinence refers to a condition in which a person is unable to control where and when to pass urine. This condition may be caused by medicines, infection, weak bladder muscles, weak pelvic floor muscles, enlargement of the prostate (in men), or surgery. Factors such as older age, obesity, pregnancy and childbirth, menopause, neurological diseases, and chronic coughing may increase your risk for developing this condition. Types of urinary incontinence include urge incontinence, stress incontinence, overflow incontinence, and functional incontinence. This condition is usually treated first with lifestyle and behavioral changes, such as quitting smoking, eating a healthier diet, and doing regular pelvic floor exercises. Other treatment options include medicines, bulking agents, medical devices, electrical nerve stimulation, or surgery. This information is not intended to replace advice given to you by your health care provider. Make sure you discuss any questions you have with your health care provider. Document Revised: 05/29/2020 Document Reviewed: 05/29/2020 Elsevier Patient Education  [place]   We recommended that you start or continue a regular exercise program for good  health. Physical activity is anything that gets your body moving, some is better than none. The CDC recommends 150 minutes per week of Moderate-Intensity Aerobic Activity and 2 or more days of Muscle Strengthening Activity.  Benefits of exercise are limitless: helps weight loss/weight maintenance, improves mood and energy, helps with depression and anxiety, improves sleep, tones and strengthens muscles, improves balance, improves bone density, protects from chronic conditions such as heart disease, high blood pressure and diabetes and so much more. To learn more visit: WhyNotPoker.uy  DIET: Good nutrition starts with a healthy diet of fruits, vegetables, whole grains, and lean protein sources. Drink plenty of water for hydration. Minimize empty calories, sodium, sweets. For more information about dietary recommendations visit: GeekRegister.com.ee and http://schaefer-mitchell.com/  ALCOHOL:  Women should limit their alcohol intake to no more than 7 drinks/beers/glasses of wine (combined, not each!) per week. Moderation of alcohol intake to this level decreases your risk of breast cancer and liver damage.  If you are concerned that you may have a problem, or your friends have told you they are concerned about your drinking, there are many resources to help. A well-known program that is free, effective, and available to all people all over the nation is Alcoholics Anonymous.  Check out this site to learn more: BlockTaxes.se   PAP SMEARS:  Pap smears, to check for cervical cancer or precancers,  have traditionally been done yearly, scientific advances have shown that most women can have pap smears less often.  However, every woman still should have a physical exam from her gynecologist every year. It will include a breast check,  inspection of the vulva and vagina to check for abnormal growths or skin changes, a visual exam  of the cervix, and then an exam to evaluate the size and shape of the uterus and ovaries. We will also provide age appropriate advice regarding health maintenance, like when you should have certain vaccines, screening for sexually transmitted diseases, bone density testing, colonoscopy, mammograms, etc.   MAMMOGRAMS:  All women over 11 years old should have a routine mammogram.   COLON CANCER SCREENING: Now recommend starting at age 33. At this time colonoscopy is not covered for routine screening until 50. There are take home tests that can be done between 45-49.   COLONOSCOPY:  Colonoscopy to screen for colon cancer is recommended for all women at age 65.  We know, you hate the idea of the prep.  We agree, BUT, having colon cancer and not knowing it is worse!!  Colon cancer so often starts as a polyp that can be seen and removed at colonscopy, which can quite literally save your life!  And if your first colonoscopy is normal and you have no family history of colon cancer, most women don't have to have it again for 10 years.  Once every ten years, you can do something that may end up saving your life, right?  We will be happy to help you get it scheduled when you are ready.  Be sure to check your insurance coverage so you understand how much it will cost.  It may be covered as a preventative service at no cost, but you should check your particular policy.      Breast Self-Awareness Breast self-awareness means being familiar with how your breasts look and feel. It involves checking your breasts regularly and reporting any changes to your health care provider. Practicing breast self-awareness is important. A change in your breasts can be a sign of a serious medical problem. Being familiar with how your breasts look and feel allows you to find any problems early, when treatment is more likely to be successful. All women should practice breast self-awareness, including women who have had breast  implants. How to do a breast self-exam One way to learn what is normal for your breasts and whether your breasts are changing is to do a breast self-exam. To do a breast self-exam: Look for Changes  Remove all the clothing above your waist. Stand in front of a mirror in a room with good lighting. Put your hands on your hips. Push your hands firmly downward. Compare your breasts in the mirror. Look for differences between them (asymmetry), such as: Differences in shape. Differences in size. Puckers, dips, and bumps in one breast and not the other. Look at each breast for changes in your skin, such as: Redness. Scaly areas. Look for changes in your nipples, such as: Discharge. Bleeding. Dimpling. Redness. A change in position. Feel for Changes Carefully feel your breasts for lumps and changes. It is best to do this while lying on your back on the floor and again while sitting or standing in the shower or tub with soapy water on your skin. Feel each breast in the following way: Place the arm on the side of the breast you are examining above your head. Feel your breast with the other hand. Start in the nipple area and make  inch (2 cm) overlapping circles to feel your breast. Use the pads of your three middle fingers to do this. Apply light pressure, then medium pressure, then firm pressure.  The light pressure will allow you to feel the tissue closest to the skin. The medium pressure will allow you to feel the tissue that is a little deeper. The firm pressure will allow you to feel the tissue close to the ribs. Continue the overlapping circles, moving downward over the breast until you feel your ribs below your breast. Move one finger-width toward the center of the body. Continue to use the  inch (2 cm) overlapping circles to feel your breast as you move slowly up toward your collarbone. Continue the up and down exam using all three pressures until you reach your armpit.  Write Down What  You Find  Write down what is normal for each breast and any changes that you find. Keep a written record with breast changes or normal findings for each breast. By writing this information down, you do not need to depend only on memory for size, tenderness, or location. Write down where you are in your menstrual cycle, if you are still menstruating. If you are having trouble noticing differences in your breasts, do not get discouraged. With time you will become more familiar with the variations in your breasts and more comfortable with the exam. How often should I examine my breasts? Examine your breasts every month. If you are breastfeeding, the best time to examine your breasts is after a feeding or after using a breast pump. If you menstruate, the best time to examine your breasts is 5-7 days after your period is over. During your period, your breasts are lumpier, and it may be more difficult to notice changes. When should I see my health care provider? See your health care provider if you notice: A change in shape or size of your breasts or nipples. A change in the skin of your breast or nipples, such as a reddened or scaly area. Unusual discharge from your nipples. A lump or thick area that was not there before. Pain in your breasts. Anything that concerns you.

## 2022-07-28 ENCOUNTER — Telehealth: Payer: Self-pay | Admitting: *Deleted

## 2022-07-28 DIAGNOSIS — N3946 Mixed incontinence: Secondary | ICD-10-CM

## 2022-07-28 NOTE — Telephone Encounter (Signed)
Patient scheduled at [facility] physical therapy on 08/02/22 @ 2pm with [place] office notes faxed to [phone]. Patient informed with time and date.

## 2022-07-28 NOTE — Telephone Encounter (Signed)
-----   Message from Salvadore Dom, MD sent at 07/27/2022  2:51 PM EDT ----- Please find a pelvic floor PT office in [place] and place a referral for this patient. She has mixed incontinence. Thanks, Sharee Pimple

## 2022-07-28 NOTE — Telephone Encounter (Signed)
I called and [place] [facility] at [place] [facility] to call regarding referral [phone]

## 2022-12-26 ENCOUNTER — Ambulatory Visit (INDEPENDENT_AMBULATORY_CARE_PROVIDER_SITE_OTHER): Payer: 59 | Admitting: Family

## 2022-12-26 ENCOUNTER — Encounter: Payer: Self-pay | Admitting: Family

## 2022-12-26 VITALS — BP 112/68 | HR 102 | Temp 98.4°F | Resp 16 | Ht 60.5 in | Wt 188.0 lb

## 2022-12-26 DIAGNOSIS — F418 Other specified anxiety disorders: Secondary | ICD-10-CM

## 2022-12-26 DIAGNOSIS — Z0000 Encounter for general adult medical examination without abnormal findings: Secondary | ICD-10-CM | POA: Diagnosis not present

## 2022-12-26 DIAGNOSIS — Z23 Encounter for immunization: Secondary | ICD-10-CM | POA: Diagnosis not present

## 2022-12-26 DIAGNOSIS — Z1231 Encounter for screening mammogram for malignant neoplasm of breast: Secondary | ICD-10-CM

## 2022-12-26 DIAGNOSIS — E785 Hyperlipidemia, unspecified: Secondary | ICD-10-CM | POA: Diagnosis not present

## 2022-12-26 LAB — CBC WITH DIFFERENTIAL/PLATELET
Basophils Absolute: 0 10*3/uL (ref 0.0–0.1)
Basophils Relative: 0.7 % (ref 0.0–3.0)
Eosinophils Absolute: 0.1 10*3/uL (ref 0.0–0.7)
Eosinophils Relative: 1.3 % (ref 0.0–5.0)
HCT: 38.4 % (ref 36.0–46.0)
Hemoglobin: 12.9 g/dL (ref 12.0–15.0)
Lymphocytes Relative: 30.3 % (ref 12.0–46.0)
Lymphs Abs: 2 10*3/uL (ref 0.7–4.0)
MCHC: 33.6 g/dL (ref 30.0–36.0)
MCV: 83.8 fl (ref 78.0–100.0)
Monocytes Absolute: 0.5 10*3/uL (ref 0.1–1.0)
Monocytes Relative: 7.1 % (ref 3.0–12.0)
Neutro Abs: 4 10*3/uL (ref 1.4–7.7)
Neutrophils Relative %: 60.6 % (ref 43.0–77.0)
Platelets: 308 10*3/uL (ref 150.0–400.0)
RBC: 4.58 Mil/uL (ref 3.87–5.11)
RDW: 13.4 % (ref 11.5–15.5)
WBC: 6.5 10*3/uL (ref 4.0–10.5)

## 2022-12-26 LAB — COMPREHENSIVE METABOLIC PANEL
ALT: 10 U/L (ref 0–35)
AST: 8 U/L (ref 0–37)
Albumin: 3.9 g/dL (ref 3.5–5.2)
Alkaline Phosphatase: 86 U/L (ref 39–117)
BUN: 14 mg/dL (ref 6–23)
CO2: 29 mEq/L (ref 19–32)
Calcium: 9.3 mg/dL (ref 8.4–10.5)
Chloride: 103 mEq/L (ref 96–112)
Creatinine, Ser: 0.65 mg/dL (ref 0.40–1.20)
GFR: 93.88 mL/min (ref >60.00)
Glucose, Bld: 91 mg/dL (ref 70–99)
Potassium: 4.2 mEq/L (ref 3.5–5.1)
Sodium: 140 mEq/L (ref 135–145)
Total Bilirubin: 0.6 mg/dL (ref 0.2–1.2)
Total Protein: 7.2 g/dL (ref 6.0–8.3)

## 2022-12-26 LAB — TSH: TSH: 2.65 u[IU]/mL (ref 0.35–5.50)

## 2022-12-26 LAB — LIPID PANEL
Cholesterol: 198 mg/dL (ref 0–200)
HDL: 78.9 mg/dL (ref >39.00)
LDL Cholesterol: 106 mg/dL — ABNORMAL HIGH (ref 0–99)
NonHDL: 119.06
Total CHOL/HDL Ratio: 3
Triglycerides: 66 mg/dL (ref 0.0–149.0)
VLDL: 13.2 mg/dL (ref 0.0–40.0)

## 2022-12-26 MED ORDER — ESCITALOPRAM OXALATE 10 MG PO TABS: ORAL_TABLET | ORAL | 0 refills | Status: DC

## 2022-12-26 NOTE — Progress Notes (Signed)
Subjective:   By signing my name below, I, Traci [name], attest that this documentation has been prepared under the direction and in the presence of Traci Alar, NP. 12/26/2022   Patient ID: Traci [name], female    DOB: 20-Oct-1961, 62 y.o.   MRN: [id number]  No chief complaint on file.    HPI Patient is in today for a comprehensive physical exam.  Skin: She complains of a rash on the back of her neck.  Stress: She has increased stress due to work and now has to work part-time. She also has gained weight overtime due to stress and has not been exercising regularly.She is interested in counseling and also medication to manage it.  Wt Readings from Last 3 Encounters:  12/26/22 188 lb (85.3 kg)  07/27/22 185 lb (83.9 kg)  12/24/21 180 lb 6.4 oz (81.8 kg)   Knee pain: She fell recently and is having some knee discomfort.   Thyroid: She is interested in getting her thyroid checked in her next blood work.   Acute: She denies fever, unexpected weight change, adenopathy, new moles, sinus pain, sore throat, visual disturbance, chest pain, palpitations, leg swelling, cough, shortness of breath, wheezing, nausea, vomiting, diarrhea, constipation, blood in stool, dysuria, frequency, hematuria, new muscle pain, depression or anxiety at this time.   Social history: Her parents are both living. Her mom is 18 years old and her dad is 55 years old.   Immunizations: She is UTD on flu and covid-19 vaccinations. She is interested in getting a Tetanus vaccination during this appointment.   Diet: She has been following a diet.   Exercise: She does not exercise regularly.   Colonoscopy: Last colonoscopy on 07/31/2015. Results were normal except a slightly decreased sphincter and a diminutive wart polyp.  Dexa: The BMD measured at Femur Neck Right is 0.817 g/cm2 with a T-score of -1.6. This patient is considered osteopenic according to [facility]) criteria. Repeat in two  years.   Mammogram: Last mammogram on 12/30/21. Results were normal. Repeat in one year. She is scheduling an appointment.   Dental: She goes to dental appointments every 6 months.   Vision:  She had an appointment in March and got new glasses.  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 Maintained on Keppra-no seizure activity since surgery   CHOLECYSTECTOMY  03/2007   lap chole   CYSTOSCOPY  12/04/2012   Procedure: CYSTOSCOPY;  Surgeon: Peri Maris, MD;  Location: [facility];  Service: Gynecology;  Laterality: N/A;   ROBOTIC ASSISTED TOTAL HYSTERECTOMY  12/04/2012   Procedure: ROBOTIC ASSISTED TOTAL HYSTERECTOMY;  Surgeon: Peri Maris, MD;  Location: [facility];  Service: Gynecology;  Laterality: Bilateral;  with bilateral salpingectomy    Family History  Problem Relation Age of Onset   Osteoarthritis Mother  Osteoporosis Mother    Hypothyroidism Mother    Hashimoto's thyroiditis Mother    Abdominal Wall Hernia Father    Benign prostatic hyperplasia Father    Atrial fibrillation Father    Heart failure Maternal Grandfather    Heart attack Maternal Grandfather    Cancer Paternal Grandfather        colon cancer   Stroke Paternal Grandmother    Hashimoto's thyroiditis Brother     Social History   Socioeconomic History   Marital status:  Married    Spouse name: Legrand Como   Number of children: Not on file   Years of education: Not on file   Highest education level: Not on file  Occupational History   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: [employer]  Tobacco Use   Smoking status: Never   Smokeless tobacco: Never  Substance and Sexual Activity   Alcohol use: Yes    Alcohol/week: 0.0 - 1.0 standard drinks of alcohol   Drug use: No   Sexual activity: Not Currently    Partners: Male    Birth control/protection: Surgical    Comment: hysterectomy  Other Topics Concern   Not on file  Social History Narrative   2 children   Second marriage   Husband is living in [place]   Works at [employer] store   Social Determinants of Radio broadcast assistant Strain: Not on [employer] Insecurity: Not on file  Transportation Needs: Not on file  Physical Activity: Not on file  Stress: Not on file  Social Connections: Not on file  Intimate Partner Violence: Not on file    Outpatient Medications Prior to Visit  Medication Sig Dispense Refill   calcium carbonate (OS-CAL) 600 MG tablet      Cholecalciferol (VITAMIN D-3) 125 MCG (5000 UT) TABS      loratadine (CLARITIN) 10 MG tablet Take 10 mg by mouth daily.     Multiple Vitamin (MULTIVITAMIN ADULT PO) Multivitamin     triamcinolone cream (KENALOG) 0.5 % APPLY TO AFFECTED AREA TWICE A DAY FOR 7 DAYS     influenza vac split quadrivalent PF (FLUARIX QUADRIVALENT) 0.5 ML injection Inject into the muscle. (Patient not taking: Reported on 07/27/2022) 0.5 mL 0   No facility-administered medications prior to visit.    Allergies  Allergen Reactions   Pollen Extract Other (See Comments)    NASAL CONGESTION, SINUS DRAINAGE   Penicillins     childhood   Poison Ivy Extract [Poison Ivy Extract] Hives and Rash    Review of Systems  Constitutional:  Negative for fever.       (-) unexpected weight changes   HENT:  Negative for sinus pain and sore  throat.   Eyes:        (-)visual disturbance  Respiratory:  Negative for cough, shortness of breath and wheezing.   Cardiovascular:  Negative for chest pain and palpitations.  Gastrointestinal:  Negative for blood in stool, constipation, diarrhea, nausea and vomiting.  Genitourinary:  Negative for dysuria, frequency, hematuria and urgency.  Musculoskeletal:        (-) new muscle pain (+)knee discomfort  Skin:  Positive for rash (back of neck).       (-)new moles  Neurological:  Positive for headaches.       Headaches in the morning   Psychiatric/Behavioral:  Negative for depression.        (-) anxiety       Objective:  Physical Exam Constitutional:      General: She is not in acute distress.    Appearance: Normal appearance.  HENT:     Head: Normocephalic and atraumatic.     Right Ear: Tympanic membrane, ear canal and external ear normal.     Left Ear: Tympanic membrane, ear canal and external ear normal.  Eyes:     Extraocular Movements: Extraocular movements intact.     Right eye: No nystagmus.     Left eye: No nystagmus.     Pupils: Pupils are equal, round, and reactive to light.  Cardiovascular:     Rate and Rhythm: Normal rate and regular rhythm.     Heart sounds: Normal heart sounds. No murmur heard.    No gallop.  Pulmonary:     Effort: Pulmonary effort is normal. No respiratory distress.     Breath sounds: Normal breath sounds. No wheezing or rales.  Abdominal:     General: Bowel sounds are normal. There is no distension.     Palpations: Abdomen is soft.     Tenderness: There is no abdominal tenderness. There is no guarding.  Musculoskeletal:     Cervical back: Normal range of motion.     Comments: (+) knee pain (+) 5/5 strength on upper and lower extremities  Lymphadenopathy:     Cervical: No cervical adenopathy.  Skin:    General: Skin is warm.  Neurological:     Mental Status: She is alert and oriented to person, place, and time.     Deep Tendon  Reflexes:     Reflex Scores:      Patellar reflexes are 2+ on the right side and 2+ on the left side. Psychiatric:        Judgment: Judgment normal.   Breasts: Examined lying and sitting.  Right: Without masses, retractions, discharge or axillary adenopathy.  Left: Without masses, retractions, discharge or axillary adenopathy.   BP 112/68 (BP Location: Right Arm, Patient Position: Sitting, Cuff Size: Large)   Pulse (!) 102   Temp 98.4 F (36.9 C) (Oral)   Resp 16   Ht 5' 0.5" (1.537 m)   Wt 188 lb (85.3 kg)   LMP 11/29/2012 (Exact Date)   SpO2 96%   BMI 36.11 kg/m  Wt Readings from Last 3 Encounters:  12/26/22 188 lb (85.3 kg)  07/27/22 185 lb (83.9 kg)  12/24/21 180 lb 6.4 oz (81.8 kg)       Assessment & Plan:  Preventative health care Assessment & Plan: Wt Readings from Last 3 Encounters:  12/26/22 188 lb (85.3 kg)  07/27/22 185 lb (83.9 kg)  12/24/21 180 lb 6.4 oz (81.8 kg)   Refer for mammo, Td today.  Pap up to date.  Colo due 2026.  Discussed healthy diet, exercise and weight loss.   Orders: -     TSH -     CBC with Differential/Platelet  Hyperlipidemia, unspecified hyperlipidemia type -     Comprehensive metabolic panel -     Lipid panel  Depression with anxiety Assessment & Plan: Uncontrolled. She is interested in both medication trial as well as counseling. I gave her information on our counseling services.  Will also initiate lexapro 57m.  I instructed pt to start 1/2 tablet once daily for 1 week and then increase to a full tablet once daily on week two as tolerated.  We discussed common side effects such as nausea, drowsiness and weight gain.    Pt verbalizes understanding.  Plan follow  up in 1 month to evaluate progress.     Orders: -     Escitalopram Oxalate; 1/2 tablet by mouth once daily for 1 week, then increase to a full tab once daily on week two  Dispense: 30 tablet; Refill: 0  Breast cancer screening by mammogram -     3D Screening  Mammogram, Left and Right; Future  Need for Td vaccine -     Td vaccine greater than or equal to 7yo preservative free IM    I, Nance Pear, NP, personally preformed the services described in this documentation.  All medical record entries made by the scribe were at my direction and in my presence.  I have reviewed the chart and discharge instructions (if applicable) and agree that the record reflects my personal performance and is accurate and complete. 12/26/2022   Lacretia Leigh as a scribe for Nance Pear, NP.,have documented all relevant documentation on the behalf of Nance Pear, NP,as directed by  Nance Pear, NP while in the presence of Nance Pear, NP.   Nance Pear, NP

## 2022-12-26 NOTE — Assessment & Plan Note (Signed)
Uncontrolled. She is interested in both medication trial as well as counseling. I gave her information on our counseling services.  Will also initiate lexapro 60m.  I instructed pt to start 1/2 tablet once daily for 1 week and then increase to a full tablet once daily on week two as tolerated.  We discussed common side effects such as nausea, drowsiness and weight gain.    Pt verbalizes understanding.  Plan follow up in 1 month to evaluate progress.

## 2022-12-26 NOTE — Assessment & Plan Note (Addendum)
Wt Readings from Last 3 Encounters:  12/26/22 188 lb (85.3 kg)  07/27/22 185 lb (83.9 kg)  12/24/21 180 lb 6.4 oz (81.8 kg)   Refer for mammo, Td today.  Pap up to date.  Colo due 2026.  Discussed healthy diet, exercise and weight loss.

## 2023-01-03 ENCOUNTER — Encounter (HOSPITAL_BASED_OUTPATIENT_CLINIC_OR_DEPARTMENT_OTHER): Payer: Self-pay

## 2023-01-03 ENCOUNTER — Ambulatory Visit (HOSPITAL_BASED_OUTPATIENT_CLINIC_OR_DEPARTMENT_OTHER)
Admission: RE | Admit: 2023-01-03 | Discharge: 2023-01-03 | Disposition: A | Discharge status: Home / Self Care | Payer: 59 | Source: Ambulatory Visit | Attending: Family | Admitting: Family

## 2023-01-03 DIAGNOSIS — Z1231 Encounter for screening mammogram for malignant neoplasm of breast: Secondary | ICD-10-CM | POA: Diagnosis present

## 2023-01-06 ENCOUNTER — Other Ambulatory Visit: Payer: Self-pay | Admitting: Family

## 2023-01-06 DIAGNOSIS — R928 Other abnormal and inconclusive findings on diagnostic imaging of breast: Secondary | ICD-10-CM

## 2023-01-17 ENCOUNTER — Other Ambulatory Visit: Payer: Self-pay | Admitting: Family

## 2023-01-17 DIAGNOSIS — F418 Other specified anxiety disorders: Secondary | ICD-10-CM

## 2023-01-24 ENCOUNTER — Ambulatory Visit
Admission: RE | Admit: 2023-01-24 | Discharge: 2023-01-24 | Disposition: A | Discharge status: Home / Self Care | Payer: 59 | Source: Ambulatory Visit | Attending: Family | Admitting: Family

## 2023-01-24 ENCOUNTER — Ambulatory Visit: Payer: 59

## 2023-01-24 ENCOUNTER — Ambulatory Visit: Admission: RE | Admit: 2023-01-24 | Payer: 59 | Source: Ambulatory Visit

## 2023-01-24 DIAGNOSIS — R928 Other abnormal and inconclusive findings on diagnostic imaging of breast: Secondary | ICD-10-CM

## 2023-01-30 NOTE — Progress Notes (Signed)
Subjective:   By signing my name below, I, Traci [name], attest that this documentation has been prepared under the direction and in the presence of Debbrah Alar, NP.  01/31/2023.   Patient ID: Traci [name], female    DOB: Jan 20, 1961, 62 y.o.   MRN: [id number]  No chief complaint on file.   HPI Patient is in today for an office visit.     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 Maintained on Keppra-no seizure activity since surgery   CHOLECYSTECTOMY  03/2007   lap chole   CYSTOSCOPY  12/04/2012   Procedure: CYSTOSCOPY;  Surgeon: Peri Maris, MD;  Location: [facility];  Service: Gynecology;  Laterality: N/A;   ROBOTIC ASSISTED TOTAL HYSTERECTOMY  12/04/2012   Procedure: ROBOTIC ASSISTED TOTAL HYSTERECTOMY;  Surgeon: Peri Maris, MD;  Location: [facility];  Service: Gynecology;  Laterality: Bilateral;  with bilateral salpingectomy    Family History  Problem Relation Age of Onset   Osteoarthritis Mother    Osteoporosis Mother    Hypothyroidism Mother    Hashimoto's thyroiditis Mother    Abdominal Wall Hernia Father    Benign prostatic hyperplasia Father    Atrial fibrillation Father    Heart failure Maternal Grandfather    Heart attack Maternal Grandfather    Cancer Paternal Grandfather        colon cancer   Stroke Paternal  Grandmother    Hashimoto's thyroiditis Brother     Social History   Socioeconomic History   Marital status: Married    Spouse name: Legrand Como   Number of children: Not on file   Years of education: Not on file   Highest education level: Not on file  Occupational History   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: [employer]  Tobacco Use   Smoking status: Never   Smokeless tobacco: Never  Substance and Sexual Activity   Alcohol use: Yes    Alcohol/week: 0.0 - 1.0 standard drinks of alcohol   Drug use: No   Sexual activity: Not Currently    Partners: Male    Birth control/protection: Surgical    Comment: hysterectomy  Other Topics Concern   Not on file  Social History Narrative   2 children   Second marriage   Husband is living in [place]   Works at [employer] store   Social Determinants of Radio broadcast assistant Strain: Not on [employer] Insecurity: Not on file  Transportation Needs: Not on file  Physical Activity: Not on file  Stress: Not on file  Social Connections: Not on file  Intimate Partner Violence: Not on file    Outpatient Medications Prior to Visit  Medication Sig Dispense Refill   calcium carbonate (OS-CAL) 600 MG tablet      Cholecalciferol (  VITAMIN D-3) 125 MCG (5000 UT) TABS      escitalopram (LEXAPRO) 10 MG tablet Take 1 tablet (10 mg total) by mouth daily. 90 tablet 0   loratadine (CLARITIN) 10 MG tablet Take 10 mg by mouth daily.     Multiple Vitamin (MULTIVITAMIN ADULT PO) Multivitamin     triamcinolone cream (KENALOG) 0.5 % APPLY TO AFFECTED AREA TWICE A DAY FOR 7 DAYS     No facility-administered medications prior to visit.    Allergies  Allergen Reactions   Pollen Extract Other (See Comments)    NASAL CONGESTION, SINUS DRAINAGE   Penicillins     childhood   Poison Ivy Extract [Poison Ivy Extract] Hives and Rash    ROS See HPI.     Objective:    Physical Exam Constitutional:      Appearance:  Normal appearance.  HENT:     Head: Normocephalic and atraumatic.     Right Ear: Tympanic membrane, ear canal and external ear normal.     Left Ear: Tympanic membrane, ear canal and external ear normal.  Eyes:     Extraocular Movements: Extraocular movements intact.     Pupils: Pupils are equal, round, and reactive to light.  Cardiovascular:     Rate and Rhythm: Normal rate and regular rhythm.     Heart sounds: Normal heart sounds. No murmur heard.    No gallop.  Pulmonary:     Effort: Pulmonary effort is normal. No respiratory distress.     Breath sounds: Normal breath sounds. No wheezing or rales.  Skin:    General: Skin is warm and dry.  Neurological:     General: No focal deficit present.     Mental Status: She is alert and oriented to person, place, and time.  Psychiatric:        Mood and Affect: Mood normal.        Behavior: Behavior normal.     LMP 11/29/2012 (Exact Date)  Wt Readings from Last 3 Encounters:  12/26/22 188 lb (85.3 kg)  07/27/22 185 lb (83.9 kg)  12/24/21 180 lb 6.4 oz (81.8 kg)    Diabetic Foot Exam - Simple   No data filed    Lab Results  Component Value Date   WBC 6.5 12/26/2022   HGB 12.9 12/26/2022   HCT 38.4 12/26/2022   PLT 308.0 12/26/2022   GLUCOSE 91 12/26/2022   CHOL 198 12/26/2022   TRIG 66.0 12/26/2022   HDL 78.90 12/26/2022   LDLCALC 106 (H) 12/26/2022   ALT 10 12/26/2022   AST 8 12/26/2022   NA 140 12/26/2022   K 4.2 12/26/2022   CL 103 12/26/2022   CREATININE 0.65 12/26/2022   BUN 14 12/26/2022   CO2 29 12/26/2022   TSH 2.65 12/26/2022   INR 1.05 03/24/2010    Lab Results  Component Value Date   TSH 2.65 12/26/2022   Lab Results  Component Value Date   WBC 6.5 12/26/2022   HGB 12.9 12/26/2022   HCT 38.4 12/26/2022   MCV 83.8 12/26/2022   PLT 308.0 12/26/2022   Lab Results  Component Value Date   NA 140 12/26/2022   K 4.2 12/26/2022   CO2 29 12/26/2022   GLUCOSE 91 12/26/2022   BUN 14 12/26/2022    CREATININE 0.65 12/26/2022   BILITOT 0.6 12/26/2022   ALKPHOS 86 12/26/2022   AST 8 12/26/2022   ALT 10 12/26/2022   PROT 7.2 12/26/2022   ALBUMIN 3.9 12/26/2022  CALCIUM 9.3 12/26/2022   GFR 93.88 12/26/2022   Lab Results  Component Value Date   CHOL 198 12/26/2022   Lab Results  Component Value Date   HDL 78.90 12/26/2022   Lab Results  Component Value Date   LDLCALC 106 (H) 12/26/2022   Lab Results  Component Value Date   TRIG 66.0 12/26/2022   Lab Results  Component Value Date   CHOLHDL 3 12/26/2022   No results found for: "HGBA1C"     Assessment & Plan:   Problem List Items Addressed This Visit   None    No orders of the defined types were placed in this encounter.   Alphonzo Grieve, personally preformed the services described in this documentation.  All medical record entries made by the scribe were at my direction and in my presence.  I have reviewed the chart and discharge instructions (if applicable) and agree that the record reflects my personal performance and is accurate and complete. 01/31/2023.  I,Mathew Stumpf,acting as a Education administrator for [employer], NP.,have documented all relevant documentation on the behalf of Nance Pear, NP,as directed by  Nance Pear, NP while in the presence of Nance Pear, NP.   Traci [name]

## 2023-01-31 ENCOUNTER — Ambulatory Visit (INDEPENDENT_AMBULATORY_CARE_PROVIDER_SITE_OTHER): Payer: 59 | Admitting: Family

## 2023-01-31 VITALS — BP 127/72 | HR 94 | Temp 98.2°F | Resp 16 | Wt 188.0 lb

## 2023-01-31 DIAGNOSIS — F418 Other specified anxiety disorders: Secondary | ICD-10-CM

## 2023-01-31 MED ORDER — ESCITALOPRAM OXALATE 20 MG PO TABS: 20.0000 mg | ORAL_TABLET | Freq: Every day | ORAL | 0 refills | Status: DC

## 2023-01-31 NOTE — Assessment & Plan Note (Signed)
No significant improvement with lexapro 10mg  but she is tolerating well.  She is looking forward to moving into a new job and working fewer hours.  She plans to start going to the [facility], schedule an appointment with a counselor.  Would like to increase lexapro dose. Will increase to 20mg . Plan follow up in 6 weeks.

## 2023-03-14 ENCOUNTER — Ambulatory Visit (INDEPENDENT_AMBULATORY_CARE_PROVIDER_SITE_OTHER): Payer: 59 | Admitting: Family

## 2023-03-14 VITALS — BP 128/82 | HR 79 | Temp 98.3°F | Resp 16 | Wt 186.0 lb

## 2023-03-14 DIAGNOSIS — F418 Other specified anxiety disorders: Secondary | ICD-10-CM

## 2023-03-14 DIAGNOSIS — R197 Diarrhea, unspecified: Secondary | ICD-10-CM

## 2023-03-14 DIAGNOSIS — M5416 Radiculopathy, lumbar region: Secondary | ICD-10-CM

## 2023-03-14 MED ORDER — ESCITALOPRAM OXALATE 20 MG PO TABS: 20.0000 mg | ORAL_TABLET | Freq: Every day | ORAL | 1 refills | Status: DC

## 2023-03-14 MED ORDER — METHYLPREDNISOLONE 4 MG PO TBPK: ORAL_TABLET | ORAL | 0 refills | Status: DC

## 2023-03-14 NOTE — Assessment & Plan Note (Signed)
She notes that she has several AM stools which become progressively looser each morning. Then stops for the rest of the day. I advised her to try cutting out dairy and see if this helps.

## 2023-03-14 NOTE — Progress Notes (Signed)
Subjective:   By signing my name below, I, Traci [name], attest that this documentation has been prepared under the direction and in the presence of Lemont Fillers, NP 03/14/23   Patient ID: Traci [name], female    DOB: [birth year], 62 y.o.   MRN: [id number]  Chief Complaint  Patient presents with   Leg Pain    Complains of left leg pain for about 3 weeks   Depression    Here for follow up    HPI Patient is in today for 6 week follow up.   Mood/Depression: She has been compliant with 20 mg Lexapro daily and notes no side effects. She has not been able to find a counselor yet. She transitioned out of her job, which she notes has been very good for her mood. She reports having trouble with motivation in the mornings.   Leg pain: She complains of left leg pain starting 3 weeks ago. Her pain radiates from her left lower back to her left knee. She states the pain began after [place] the zoo. She has tried walking and stretching her leg out in hope of alleviating the pain. She recently went to a track to walk and was only able to walk 2 laps before stopping due to the pain. She states she previously was able to do more. Pain worsens after sitting or lying down for long periods. Denies any tenderness to touch.   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 Maintained on Keppra-no seizure activity since surgery   CHOLECYSTECTOMY  03/2007   lap chole   CYSTOSCOPY  12/04/2012   Procedure: CYSTOSCOPY;  Surgeon: Alison Murray, MD;  Location: [facility];  Service: Gynecology;  Laterality: N/A;   ROBOTIC ASSISTED TOTAL HYSTERECTOMY  12/04/2012   Procedure: ROBOTIC ASSISTED TOTAL HYSTERECTOMY;  Surgeon: Alison Murray, MD;  Location: [facility];  Service: Gynecology;  Laterality: Bilateral;  with bilateral salpingectomy    Family History  Problem Relation Age of Onset   Osteoarthritis Mother    Osteoporosis Mother    Hypothyroidism Mother    Hashimoto's thyroiditis Mother    Abdominal Wall Hernia Father    Benign prostatic hyperplasia Father    Atrial fibrillation Father    Heart failure Maternal Grandfather    Heart attack Maternal Grandfather    Cancer Paternal Grandfather        colon cancer   Stroke Paternal Grandmother    Hashimoto's thyroiditis Brother     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[employer]  Employer: [employer]  Tobacco Use   Smoking status: Never   Smokeless tobacco: Never  Substance and Sexual Activity   Alcohol use: Yes    Alcohol/week: 0.0 - 1.0 standard drinks of alcohol   Drug use: No   Sexual activity: Not Currently    Partners: Male    Birth control/protection: Surgical    Comment: hysterectomy  Other Topics Concern   Not on file  Social History Narrative   2 children   Second marriage   Husband is living in [place]   Works at [employer]   Social Determinants of [employer] Drug Stores: Low Risk  (01/31/2023)   Overall Financial Resource Strain (CARDIA)    Difficulty of Paying Living Expenses: Not hard at all  Food  Insecurity: No Food Insecurity (01/31/2023)   Hunger Vital Sign    Worried About Running Out of Food in the Last Year: Never true    Ran Out of Food in the Last Year: Never true  Transportation Needs: No Transportation Needs (01/31/2023)   PRAPARE - Administrator, Civil Service (Medical): No    Lack of Transportation (Non-Medical): No  Physical Activity: Insufficiently Active (01/31/2023)   Exercise Vital Sign    Days of Exercise per Week: 1 day    Minutes of Exercise per Session: 20 min  Stress: Stress Concern Present (01/31/2023)   [employer] of Occupational Health - Occupational Stress Questionnaire    Feeling of Stress : To some extent  Social Connections: Socially Integrated (01/31/2023)   Social Connection and Isolation Panel [NHANES]    Frequency of Communication with Friends and Family: Twice a week    Frequency of Social Gatherings with Friends and Family: Twice a week    Attends Religious Services: More than 4 times per year    Active Member of [employer] or Organizations: Yes    Attends Engineer, structural: More than 4 times per year    Marital Status: Married  Catering manager Violence: Not on file    Outpatient Medications Prior to Visit  Medication Sig Dispense Refill   calcium carbonate (OS-CAL) 600 MG tablet      loratadine (CLARITIN) 10 MG tablet Take 10 mg by mouth daily.     Multiple Vitamin (MULTIVITAMIN ADULT PO) Multivitamin     triamcinolone cream (KENALOG) 0.5 % APPLY TO AFFECTED AREA TWICE A DAY FOR 7 DAYS     escitalopram (LEXAPRO) 20 MG tablet Take 1 tablet (20 mg total) by mouth daily. 90 tablet 0   Cholecalciferol (VITAMIN D-3) 125 MCG (5000 UT) TABS      No facility-administered medications prior to visit.    Allergies  Allergen Reactions   Pollen Extract Other (See Comments)    NASAL CONGESTION, SINUS DRAINAGE   Penicillins     childhood   Poison Ivy Extract [Poison Ivy Extract] Hives and Rash    Review of Systems   Musculoskeletal:  Positive for myalgias (radiates from left leg to left knee).  Psychiatric/Behavioral:  Positive for depression.        Objective:    Physical Exam Constitutional:      General: She is not in acute distress.    Appearance: Normal appearance. She is well-developed.  HENT:     Head: Normocephalic and atraumatic.     Right Ear: External ear normal.     Left Ear: External ear normal.  Eyes:     General: No scleral icterus. Neck:  Thyroid: No thyromegaly.  Cardiovascular:     Rate and Rhythm: Normal rate and regular rhythm.     Heart sounds: Normal heart sounds. No murmur heard. Pulmonary:     Effort: Pulmonary effort is normal. No respiratory distress.     Breath sounds: Normal breath sounds. No wheezing.  Musculoskeletal:     Cervical back: Neck supple.     Thoracic back: Normal. No swelling or tenderness.     Lumbar back: Normal. No swelling or tenderness.  Skin:    General: Skin is warm and dry.  Neurological:     Mental Status: She is alert and oriented to person, place, and time.     Deep Tendon Reflexes:     Reflex Scores:      Patellar reflexes are 2+ on the right side and 2+ on the left side.    Comments: Bilateral LE strength is 5/5   Psychiatric:        Mood and Affect: Mood normal.        Behavior: Behavior normal.        Thought Content: Thought content normal.        Judgment: Judgment normal.     BP 128/82 (BP Location: Right Arm, Patient Position: Sitting, Cuff Size: Small)   Pulse 79   Temp 98.3 F (36.8 C) (Oral)   Resp 16   Wt 186 lb (84.4 kg)   LMP 11/29/2012 (Exact Date)   SpO2 97%   BMI 35.73 kg/m  Wt Readings from Last 3 Encounters:  03/14/23 186 lb (84.4 kg)  01/31/23 188 lb (85.3 kg)  12/26/22 188 lb (85.3 kg)       Assessment & Plan:  Depression with anxiety Assessment & Plan: Stable/improved.  Maintained on prozac. She notes some difficulty motivating in the AM's but she is also not used to not working. I  encouraged her to make herself a daily schedule and try to stick to it.    Lumbar radiculopathy, acute Assessment & Plan: New.  Left L4 distribution. She is travelling soon to [place] wants to be able to walk without pain.  Will give a trial of a Medrol dose pak.     Diarrhea, unspecified type Assessment & Plan: She notes that she has several AM stools which become progressively looser each morning. Then stops for the rest of the day. I advised her to try cutting out dairy and see if this helps.    Other orders -     methylPREDNISolone; Take per package instructions  Dispense: 21 tablet; Refill: 0 -     Escitalopram Oxalate; Take 1 tablet (20 mg total) by mouth daily.  Dispense: 90 tablet; Refill: 1     I,Rachel Rivera,acting as a scribe for Lemont Fillers, NP.,have documented all relevant documentation on the behalf of Lemont Fillers, NP,as directed by  Lemont Fillers, NP while in the presence of Lemont Fillers, NP.   I, Lemont Fillers, NP, personally preformed the services described in this documentation.  All medical record entries made by the scribe were at my direction and in my presence.  I have reviewed the chart and discharge instructions (if applicable) and agree that the record reflects my personal performance and is accurate and complete. 03/14/23   Lemont Fillers, NP

## 2023-03-14 NOTE — Assessment & Plan Note (Signed)
New.  Left L4 distribution. She is travelling soon to [place] wants to be able to walk without pain.  Will give a trial of a Medrol dose pak.

## 2023-03-14 NOTE — Assessment & Plan Note (Addendum)
Stable/improved.  Maintained on prozac. She notes some difficulty motivating in the AM's but she is also not used to not working. I encouraged her to make herself a daily schedule and try to stick to it.

## 2023-05-29 ENCOUNTER — Encounter: Payer: Self-pay | Admitting: Nurse Practitioner

## 2023-05-29 ENCOUNTER — Ambulatory Visit (INDEPENDENT_AMBULATORY_CARE_PROVIDER_SITE_OTHER): Payer: 59 | Admitting: Nurse Practitioner

## 2023-05-29 VITALS — BP 116/78 | HR 78 | Temp 99.1°F | Ht 60.5 in | Wt 184.6 lb

## 2023-05-29 DIAGNOSIS — R309 Painful micturition, unspecified: Secondary | ICD-10-CM | POA: Diagnosis not present

## 2023-05-29 LAB — POC URINALSYSI DIPSTICK (AUTOMATED)
Bilirubin, UA: NEGATIVE
Blood, UA: NEGATIVE
Glucose, UA: NEGATIVE
Ketones, UA: NEGATIVE
Leukocytes, UA: NEGATIVE
Nitrite, UA: NEGATIVE
Protein, UA: NEGATIVE
Spec Grav, UA: >=1.03 — AB (ref 1.010–1.025)
Urobilinogen, UA: 0.2 E.U./dL — AB
pH, UA: 5 (ref 5.0–8.0)

## 2023-05-29 MED ORDER — SULFAMETHOXAZOLE-TRIMETHOPRIM 800-160 MG PO TABS: 1.0000 | ORAL_TABLET | Freq: Two times a day (BID) | ORAL | 0 refills | Status: DC

## 2023-05-29 NOTE — Patient Instructions (Signed)
It was great to see you!  Start bactrim twice a day for 7 days.   Drink plenty of fluids.   Let's follow-up if your symptoms worsen or don't improve.   Take care,  Rodman Pickle, NP

## 2023-05-29 NOTE — Progress Notes (Signed)
Acute Office Visit  Subjective:     Patient ID: Traci [name], female    DOB: May 03, 1961, 62 y.o.   MRN: [id number]  Chief Complaint  Patient presents with   Urinary Tract Infection    Since Saturday with some incontinence issues    HPI Patient is in today for dysuria that started on Saturday.  URINARY SYMPTOMS  Dysuria: burning Urinary frequency: yes Urgency: yes Small volume voids: yes Urinary incontinence: yes - chronic Foul odor: no Hematuria: no Abdominal pain: no Back pain: no Suprapubic pain/pressure: yes Flank pain: no Fever:  no Vomiting: no Relief with cranberry juice: yes Relief with pyridium:  n/a Status: better Previous urinary tract infection: yes Recurrent urinary tract infection: no Treatments attempted: cranberry   ROS See pertinent positives and negatives per HPI.     Objective:    BP 116/78 (BP Location: Left Arm)   Pulse 78   Temp 99.1 F (37.3 C) (Oral)   Ht 5' 0.5" (1.537 m)   Wt 184 lb 9.6 oz (83.7 kg)   LMP 11/29/2012 (Exact Date)   SpO2 98%   BMI 35.46 kg/m    Physical Exam Vitals and nursing note reviewed.  Constitutional:      General: She is not in acute distress.    Appearance: Normal appearance.  HENT:     Head: Normocephalic.  Eyes:     Conjunctiva/sclera: Conjunctivae normal.  Pulmonary:     Effort: Pulmonary effort is normal.  Abdominal:     General: There is no distension.     Tenderness: There is no abdominal tenderness. There is no right CVA tenderness, left CVA tenderness or guarding.  Musculoskeletal:     Cervical back: Normal range of motion.  Skin:    General: Skin is warm.  Neurological:     General: No focal deficit present.     Mental Status: She is alert and oriented to person, place, and time.  Psychiatric:        Mood and Affect: Mood normal.        Behavior: Behavior normal.        Thought Content: Thought content normal.        Judgment: Judgment normal.     Results for orders placed  or performed in visit on 05/29/23  POCT Urinalysis Dipstick (Automated)  Result Value Ref Range   Color, UA yellow    Clarity, UA     Glucose, UA Negative Negative   Bilirubin, UA Negative    Ketones, UA Negative    Spec Grav, UA >=1.030 (A) 1.010 - 1.025   Blood, UA Negative    pH, UA 5.0 5.0 - 8.0   Protein, UA Negative Negative   Urobilinogen, UA 0.2 (A) 0.2 or 1.0 E.U./dL   Nitrite, UA Negative    Leukocytes, UA Negative Negative        Assessment & Plan:   Problem List Items Addressed This Visit   None Visit Diagnoses     Pain with urination    -  Primary   U/A negative, however with history of UTI and sudden onset, will treat with bactrim BID x7 days. Add on urine culture. F/U if not improving.   Relevant Orders   POCT Urinalysis Dipstick (Automated) (Completed)   Urine Culture       Meds ordered this encounter  Medications   sulfamethoxazole-trimethoprim (BACTRIM DS) 800-160 MG tablet    Sig: Take 1 tablet by mouth 2 (two) times daily.  Dispense:  14 tablet    Refill:  0    Return if symptoms worsen or fail to improve.  Gerre Scull, NP

## 2023-05-30 LAB — URINE CULTURE
MICRO NUMBER:: 15229489
Result:: NO GROWTH
SPECIMEN QUALITY:: ADEQUATE

## 2023-07-26 ENCOUNTER — Telehealth: Payer: Self-pay | Admitting: Family

## 2023-07-26 NOTE — Telephone Encounter (Signed)
Pt wanted to let pcp know she stopped taking lexapro because it was making her hair fall out.

## 2023-07-31 ENCOUNTER — Ambulatory Visit: Payer: 59 | Admitting: Obstetrics and Gynecology

## 2023-08-16 ENCOUNTER — Encounter: Payer: Self-pay | Admitting: Gastroenterology

## 2023-08-16 LAB — HM COLONOSCOPY

## 2023-08-17 ENCOUNTER — Telehealth: Payer: Self-pay | Admitting: Family

## 2023-08-17 ENCOUNTER — Encounter: Payer: Self-pay | Admitting: Family

## 2023-08-17 NOTE — Telephone Encounter (Signed)
Addressed in different phone note.  

## 2023-08-17 NOTE — Telephone Encounter (Signed)
I discussed pt's concerns in detail with her over the phone.  I advised that she reach out to the Office of Patient Experience at [facility] and if they are unable to help her that she reach out to the Practice Administrator at the GI office where her procedure was performed.

## 2023-08-17 NOTE — Telephone Encounter (Signed)
Pt called and requested to speak with Melissa to discuss her experience while completing her colonoscopy. She expressed that she is not happy and would like to directly speak with Melissa for advice on moving forward. Please call and advise pt.

## 2023-08-24 ENCOUNTER — Other Ambulatory Visit (HOSPITAL_COMMUNITY)
Admission: RE | Admit: 2023-08-24 | Discharge: 2023-08-24 | Disposition: A | Discharge status: Home / Self Care | Payer: 59 | Source: Ambulatory Visit | Attending: Obstetrics and Gynecology | Admitting: Obstetrics and Gynecology

## 2023-08-24 ENCOUNTER — Encounter: Payer: Self-pay | Admitting: Obstetrics and Gynecology

## 2023-08-24 ENCOUNTER — Ambulatory Visit: Payer: 59 | Admitting: Obstetrics and Gynecology

## 2023-08-24 VITALS — BP 114/70 | HR 91 | Ht 60.25 in | Wt 185.0 lb

## 2023-08-24 DIAGNOSIS — Z01419 Encounter for gynecological examination (general) (routine) without abnormal findings: Secondary | ICD-10-CM | POA: Insufficient documentation

## 2023-08-24 DIAGNOSIS — N951 Menopausal and female climacteric states: Secondary | ICD-10-CM

## 2023-08-24 DIAGNOSIS — N393 Stress incontinence (female) (male): Secondary | ICD-10-CM

## 2023-08-24 DIAGNOSIS — E2839 Other primary ovarian failure: Secondary | ICD-10-CM

## 2023-08-24 DIAGNOSIS — Z1231 Encounter for screening mammogram for malignant neoplasm of breast: Secondary | ICD-10-CM

## 2023-08-24 NOTE — Progress Notes (Signed)
62 y.o. y.o. female here for annual exam.  H/o hysterectomy ovaries intact. G2P2 son in [place] and Daughter in [place] She is married to second husband   Patient's last menstrual period was 11/29/2012 (exact date).  Bothersome SUI,  wears daily pads Health Maintenance: Anal Pap:  06-29-20 neg HPV HR neg Vaginal Pap: negative, negative HPV History of abnormal Pap:  Yes abnormal smear of anus,  HPV 8/14. No h/o cervical dysplasia. MMG:  12/30/21 density B Bi-rads 1 neg  BMD:   12/30/21 osteopenic, T score -1.6, FRAX 8.2/0.8%  Colonoscopy: 2016 WNL f/u 10 years  TDaP:  10/25/13 Gardasil: NA Health Maintenance: Anal Pap:  06-29-20 neg HPV HR neg Vaginal Pap: negative, negative HPV History of abnormal Pap:  Yes abnormal smear of anus,  HPV 8/14. No h/o cervical dysplasia. MMG:  12/30/21 density B Bi-rads 1 neg  BMD:   12/30/21 osteopenic, T score -1.6, FRAX 8.2/0.8%  Colonoscopy: 2016 WNL f/u 10 years  TDaP:  10/25/13 Gardasil: NA Height 5' 0.25" (1.53 m), weight 185 lb (83.9 kg), last menstrual period 11/29/2012.     Component Value Date/Time   DIAGPAP  06/29/2020 0928    - Negative for intraepithelial lesion or malignancy (NILM)   HPVHIGH Negative 06/29/2020 0928   ADEQPAP Satisfactory for evaluation. 06/29/2020 0928    GYN HISTORY:    Component Value Date/Time   DIAGPAP  06/29/2020 0928    - Negative for intraepithelial lesion or malignancy (NILM)   HPVHIGH Negative 06/29/2020 0928   ADEQPAP Satisfactory for evaluation. 06/29/2020 0928    OB History  Gravida Para Term Preterm AB Living  2 2 2  0 0 2  SAB IAB Ectopic Multiple Live Births  0 0 0 0 2    # Outcome Date GA Lbr Len/2nd Weight Sex Type Anes PTL Lv  2 Term         LIV  1 Term         LIV    Past Medical History:  Diagnosis Date   Abnormal cytologic smear of anus with positive test for anal human papillomavirus (HPV) 06/2013   vaginal/ vulvar Colpo without biopsy -no lesions   Allergy    Aneurysm (HCC)  03/26/2011   brain   COVID-19 virus infection 08/03/2021   Osteopenia    Plantar fasciitis 11/15/2017   S/P laparoscopic assisted vaginal hysterectomy (LAVH) 12/04/2012   Robotic and bilateral salpingectomy (ovaries remain)   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 Maintained on Keppra-no seizure activity since surgery   CHOLECYSTECTOMY  03/2007   lap chole   CYSTOSCOPY  12/04/2012   Procedure: CYSTOSCOPY;  Surgeon: Alison Murray, MD;  Location: [facility];  Service: Gynecology;  Laterality: N/A;   ROBOTIC ASSISTED TOTAL HYSTERECTOMY  12/04/2012   Procedure: ROBOTIC ASSISTED TOTAL HYSTERECTOMY;  Surgeon: Alison Murray, MD;  Location: [facility];  Service: Gynecology;  Laterality: Bilateral;  with bilateral salpingectomy    Current Outpatient Medications on File Prior to Visit  Medication Sig Dispense Refill   calcium carbonate (OS-CAL) 600 MG tablet      loratadine (CLARITIN) 10 MG tablet Take 10 mg by mouth daily.     Multiple Vitamin (MULTIVITAMIN ADULT PO) Multivitamin     triamcinolone cream (KENALOG) 0.5 %  APPLY TO AFFECTED AREA TWICE A DAY FOR 7 DAYS     No current facility-administered medications on file prior to visit.    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    Employer: [employer]  Tobacco Use   Smoking status: Never   Smokeless tobacco: Never  Substance and Sexual Activity   Alcohol use: Yes    Alcohol/week: 0.0 - 1.0 standard drinks of alcohol   Drug use: No   Sexual activity: Not Currently    Partners: Male     Birth control/protection: Surgical    Comment: hysterectomy  Other Topics Concern   Not on file  Social History Narrative   2 children   Second marriage   Husband is living in [place]   Works at [employer]   Social Determinants of [employer]: Low Risk  (01/31/2023)   Overall Financial Resource Strain (CARDIA)    Difficulty of Paying Living Expenses: Not hard at all  Food Insecurity: No Food Insecurity (01/31/2023)   Hunger Vital Sign    Worried About Running Out of Food in the Last Year: Never true    Ran Out of Food in the Last Year: Never true  Transportation Needs: No Transportation Needs (01/31/2023)   PRAPARE - Administrator, Civil Service (Medical): No    Lack of Transportation (Non-Medical): No  Physical Activity: Insufficiently Active (01/31/2023)   Exercise Vital Sign    Days of Exercise per Week: 1 day    Minutes of Exercise per Session: 20 min  Stress: Stress Concern Present (01/31/2023)   [employer] of Occupational Health - Occupational Stress Questionnaire    Feeling of Stress : To some extent  Social Connections: Socially Integrated (01/31/2023)   Social Connection and Isolation Panel [NHANES]    Frequency of Communication with Friends and Family: Twice a week    Frequency of Social Gatherings with Friends and Family: Twice a week    Attends Religious Services: More than 4 times per year    Active Member of [employer] or Organizations: Yes    Attends Engineer, structural: More than 4 times per year    Marital Status: Married  Catering manager Violence: Not on file    Family History  Problem Relation Age of Onset   Osteoarthritis Mother    Osteoporosis Mother    Hypothyroidism Mother    Hashimoto's thyroiditis Mother    Abdominal Wall Hernia Father    Benign prostatic hyperplasia Father    Atrial fibrillation Father    Heart failure Maternal Grandfather    Heart attack Maternal Grandfather     Cancer Paternal Grandfather        colon cancer   Stroke Paternal Grandmother    Hashimoto's thyroiditis Brother      Allergies  Allergen Reactions   Pollen Extract Other (See Comments)    NASAL CONGESTION, SINUS DRAINAGE   Penicillins     childhood   Poison Ivy Extract [Poison Ivy Extract] Hives and Rash      Patient's last menstrual period was Patient's last menstrual period was 11/29/2012 (exact date).Marland Kitchen  Review of Systems Alls systems reviewed and are negative.     Physical Exam Constitutional:      Appearance: Normal appearance.  Genitourinary:     Vulva normal.     No lesions in the vagina.     Right Labia: No rash, lesions or skin changes.    Left Labia: No lesions, skin changes or rash.    Vaginal cuff intact.    No vaginal discharge or tenderness.     No vaginal prolapse present.    No vaginal atrophy present.     Right Adnexa: not tender and no mass present.    Left Adnexa: not tender and no mass present.    Cervix is not absent.     Uterus is not absent. Breasts:    Right: Normal.     Left: Normal.  HENT:     Head: Normocephalic.  Neck:     Thyroid: No thyroid mass, thyromegaly or thyroid tenderness.  Cardiovascular:     Rate and Rhythm: Normal rate and regular rhythm.     Heart sounds: Normal heart sounds, S1 normal and S2 normal.  Pulmonary:     Effort: Pulmonary effort is normal.     Breath sounds: Normal breath sounds and air entry.  Abdominal:     General: There is no distension.     Palpations: Abdomen is soft. There is no mass.     Tenderness: There is no abdominal tenderness. There is no guarding or rebound.  Musculoskeletal:        General: Normal range of motion.     Cervical back: Full passive range of motion without pain, normal range of motion and neck supple. No tenderness.     Right lower leg: No edema.     Left lower leg: No edema.  Neurological:     Mental Status: She is alert.  Skin:    General: Skin is warm.   Psychiatric:        Mood and Affect: Mood normal.        Behavior: Behavior normal.        Thought Content: Thought content normal.  Vitals and nursing note reviewed. Exam conducted with a chaperone present.      A:         [facility] GYN exam                             P:        Pap smear collected today Encouraged annual mammogram screening Colon cancer screening up-to-date DXA ordered today Labs and immunizations to do with PMD Discussed breast self exams Encouraged healthy lifestyle practices Encouraged Vit D and Calcium  SUI: desires intervention and is bothered by the leakage.  Referral placed to urogyn  No follow-ups on file.  Earley Favor

## 2023-08-28 LAB — CYTOLOGY - PAP
Comment: NEGATIVE
Diagnosis: NEGATIVE
High risk HPV: NEGATIVE

## 2023-09-18 ENCOUNTER — Ambulatory Visit: Payer: 59 | Admitting: Family

## 2023-09-19 ENCOUNTER — Ambulatory Visit (INDEPENDENT_AMBULATORY_CARE_PROVIDER_SITE_OTHER): Payer: 59 | Admitting: Family

## 2023-09-19 VITALS — BP 126/73 | HR 102 | Temp 98.7°F | Resp 16 | Ht 60.0 in | Wt 186.0 lb

## 2023-09-19 DIAGNOSIS — M5416 Radiculopathy, lumbar region: Secondary | ICD-10-CM

## 2023-09-19 DIAGNOSIS — M8580 Other specified disorders of bone density and structure, unspecified site: Secondary | ICD-10-CM | POA: Diagnosis not present

## 2023-09-19 DIAGNOSIS — F418 Other specified anxiety disorders: Secondary | ICD-10-CM

## 2023-09-19 MED ORDER — DULOXETINE HCL 30 MG PO CPEP: ORAL_CAPSULE | ORAL | 0 refills | Status: DC

## 2023-09-19 MED ORDER — BUPROPION HCL ER (XL) 150 MG PO TB24: 150.0000 mg | ORAL_TABLET | Freq: Every day | ORAL | 0 refills | Status: DC

## 2023-09-19 NOTE — Patient Instructions (Signed)
VISIT SUMMARY:  During your visit today, we discussed your ongoing depression, possible adult ADHD, marital stress, and weight management. You have discontinued Lexapro due to hair loss, and we have decided to start you on Wellbutrin. We also talked about the stress in your marriage and your concerns about weight gain. Your previous back sciatic pain has resolved.  YOUR PLAN:  -DEPRESSION AND POSSIBLE ADULT ADHD: Depression is a mood disorder that causes persistent feelings of sadness and loss of interest. Adult ADHD is a condition that includes symptoms such as difficulty paying attention, hyperactivity, and impulsiveness. We are starting you on Cymbalta. Please check in with [place] in 6 weeks to see how you are responding to the medication.  -MARITAL STRESS: Marital stress refers to the emotional strain and tension that can occur in a marriage. We recommend counseling to help you manage stress and develop coping strategies.  -WEIGHT MANAGEMENT: Weight management involves maintaining a healthy body weight through diet, exercise, and lifestyle changes. Wellbutrin may help reduce your appetite. We encourage you to follow a healthy diet and engage in regular physical activity.  -PAST SCIATICA: Sciatica is pain that radiates along the path of the sciatic nerve, which extends from your lower back through your hips and buttocks and down each leg. Your sciatica has resolved, so no current intervention is needed.  INSTRUCTIONS:  Please follow up in 6 weeks to assess your response to Wellbutrin and discuss your progress with counseling.

## 2023-09-19 NOTE — Progress Notes (Signed)
Subjective:     Patient ID: Traci [name], female    DOB: Nov 21, 1960, 62 y.o.   MRN: [id number]  Chief Complaint  Patient presents with   Depression    Here for follow up   Anxiety    Here for follow up    HPI  Discussed the use of AI scribe software for clinical note transcription with the patient, who gave verbal consent to proceed.  History of Present Illness   The patient, with [name] history of depression and anxiety, presents for [name] routine follow-up. She reports discontinuing Lexapro due to significant hair loss, which she believes was caused by the medication. The patient notes [name] decrease in hair loss since stopping the medication. She expresses ongoing stress and anxiety related to her home environment and relationship with her husband. The patient has been working on decluttering her home, which has been [name] source of tension in the relationship. Despite these efforts, the patient reports ongoing friction and resentment from her husband due to her decision to stop working full-time. The patient also mentions [name] history of procrastination and difficulty focusing, suggesting possible adult ADHD. She expresses [name] desire to improve her mental health and is open to trying new medication and counseling. The patient also reports being at her highest weight ever, which is [name] source of concern for her. She has [name] history of back sciatic pain, which has resolved.        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 Maintained on Keppra-no seizure activity since surgery   CHOLECYSTECTOMY  03/2007   lap chole   CYSTOSCOPY  12/04/2012   Procedure: CYSTOSCOPY;  Surgeon: Traci Murray, MD;  Location: [facility];  Service: Gynecology;  Laterality: N/[name];   ROBOTIC ASSISTED TOTAL HYSTERECTOMY  12/04/2012   Procedure: ROBOTIC ASSISTED TOTAL HYSTERECTOMY;  Surgeon: Traci Murray, MD;  Location: [facility];  Service: Gynecology;  Laterality: Bilateral;  with bilateral salpingectomy    Family History  Problem Relation Age of Onset   Osteoarthritis Mother    Osteoporosis Mother    Hypothyroidism Mother    Hashimoto's thyroiditis Mother    Abdominal Wall Hernia Father    Benign prostatic hyperplasia Father    Atrial fibrillation Father    Heart failure Maternal Grandfather    Heart attack Maternal Grandfather    Cancer Paternal Grandfather        colon cancer   Stroke Paternal Grandmother    Hashimoto's thyroiditis Brother     Social History   Socioeconomic History   Marital status: Married    Spouse name: Traci [name]   Number of children: Not on file   Years of education: Not on file   Highest education  level: Bachelor's degree (e.g., BA, AB, BS)  Occupational History   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    Employer: [employer]  Tobacco Use   Smoking status: Never   Smokeless tobacco: Never  Substance and Sexual Activity   Alcohol use: Yes    Alcohol/week: 0.0 - 1.0 standard drinks of alcohol   Drug use: No   Sexual activity: Not Currently    Partners: Male    Birth control/protection: Surgical    Comment: hysterectomy  Other Topics Concern   Not on file  Social History Narrative   2 children   Second marriage   Husband is living  in [place]   Works at [employer]   Social Determinants of [employer]: Low Risk  (09/18/2023)   Overall Financial Resource Strain (CARDIA)    Difficulty of Paying Living Expenses: Not hard at all  Food Insecurity: No Food Insecurity (09/18/2023)   Hunger Vital Sign    Worried About Running Out of Food in the Last Year: Never true    Ran Out of Food in the Last Year: Never true  Transportation Needs: No Transportation Needs (09/18/2023)   PRAPARE - Administrator, Civil Service (Medical): No    Lack of Transportation (Non-Medical): No  Physical Activity: Insufficiently Active (09/18/2023)   Exercise Vital Sign    Days of Exercise per Week: 1 day    Minutes of Exercise per Session: 10 min  Stress: Stress Concern Present (09/18/2023)   [employer] of Occupational Health - Occupational Stress Questionnaire    Feeling of Stress : Rather much  Social Connections: Socially Integrated (09/18/2023)   Social Connection and Isolation Panel [NHANES]    Frequency of Communication with Friends and Family: More than three times [name] week    Frequency of Social Gatherings with Friends and Family: Twice [name] week    Attends Religious Services: More than 4 times per year    Active Member of [employer] or Organizations: Yes    Attends Engineer, structural: More than 4 times per year    Marital Status: Married  Catering manager Violence: Not on file    Outpatient Medications Prior to Visit  Medication Sig Dispense Refill   calcium carbonate (OS-CAL) 600 MG tablet      loratadine (CLARITIN) 10 MG tablet Take 10 mg by mouth daily.     Multiple Vitamin (MULTIVITAMIN ADULT PO) Multivitamin     triamcinolone cream (KENALOG) 0.5 % APPLY TO AFFECTED AREA TWICE [name] DAY FOR 7 DAYS     No facility-administered medications prior to visit.    Allergies  Allergen Reactions   Pollen Extract Other (See Comments)    NASAL CONGESTION, SINUS DRAINAGE    Penicillins     childhood   Poison Ivy Extract [Poison Ivy Extract] Hives and Rash    ROS See HPI    Objective:    Physical Exam Constitutional:      General: She is not in acute distress.    Appearance: Normal appearance. She is well-developed.  HENT:     Head: Normocephalic and atraumatic.     Right Ear: External ear normal.     Left Ear: External ear normal.  Eyes:     General: No scleral icterus. Neck:     Thyroid: No thyromegaly.  Cardiovascular:     Rate and Rhythm: Normal rate and regular rhythm.     Heart sounds: Normal heart sounds. No  murmur heard. Pulmonary:     Effort: Pulmonary effort is normal. No respiratory distress.     Breath sounds: Normal breath sounds. No wheezing.  Musculoskeletal:     Cervical back: Neck supple.  Skin:    General: Skin is warm and dry.  Neurological:     Mental Status: She is alert and oriented to person, place, and time.  Psychiatric:        Mood and Affect: Mood normal.        Behavior: Behavior normal.        Thought Content: Thought content normal.        Judgment: Judgment normal.      BP 126/73 (BP Location: Right Arm, Patient Position: Sitting, Cuff Size: Normal)   Pulse (!) 102   Temp 98.7 F (37.1 C) (Oral)   Resp 16   Ht 5' (1.524 m)   Wt 186 lb (84.4 kg)   LMP 11/29/2012 (Exact Date)   SpO2 97%   BMI 36.33 kg/m  Wt Readings from Last 3 Encounters:  09/19/23 186 lb (84.4 kg)  08/24/23 185 lb (83.9 kg)  05/29/23 184 lb 9.6 oz (83.7 kg)       Assessment & Plan:   Problem List Items Addressed This Visit       Unprioritized   Osteopenia - Primary    2023 Dexa:  Major Osteoporotic Fracture: 8.2% Hip Fracture:                0.8% Population:                  [place] (Caucasian) Risk Factors:                None   Continue calcium and weight bearing exercise.       Lumbar radiculopathy, acute    Clinically resolved.  Monitor.       Relevant Medications   DULoxetine (CYMBALTA) 30 MG capsule    Depression with anxiety    Uncontrolled.  Initially we discussed trial of wellbutrin.  However, after further evaluation, we discussed that given her previous history of seizure, it would not likely be the best choice.  Will rx with cymbalta instead.       Relevant Medications   DULoxetine (CYMBALTA) 30 MG capsule    Left message on pharmacy voicemail- cancelling rx for wellbutrin.  Pt also aware of change.    I have discontinued Traci Milko. [place] "Kim"'s buPROPion. I am also having her start on DULoxetine. Additionally, I am having her maintain her triamcinolone cream, loratadine, calcium carbonate, and Multiple Vitamin (MULTIVITAMIN ADULT PO).  Meds ordered this encounter  Medications   DISCONTD: buPROPion (WELLBUTRIN XL) 150 MG 24 hr tablet    Sig: Take 1 tablet (150 mg total) by mouth daily.    Dispense:  90 tablet    Refill:  0    Order Specific Question:   Supervising Provider    Answer:   Traci [name] [[id number]]   DULoxetine (CYMBALTA) 30 MG capsule    Sig: Take 1 cap by mouth once daily for 3 days, then increase to 2 caps daily.    Dispense:  180 capsule    Refill:  0    Order Specific Question:   Supervising Provider    Answer:   Traci [name] [[id number]]

## 2023-09-19 NOTE — Assessment & Plan Note (Signed)
2023 Dexa:  Major Osteoporotic Fracture: 8.2% Hip Fracture:                0.8% Population:                  [place] (Caucasian) Risk Factors:                None   Continue calcium and weight bearing exercise.

## 2023-09-19 NOTE — Assessment & Plan Note (Signed)
Uncontrolled.  Initially we discussed trial of wellbutrin.  However, after further evaluation, we discussed that given her previous history of seizure, it would not likely be the best choice.  Will rx with cymbalta instead.

## 2023-09-19 NOTE — Assessment & Plan Note (Signed)
Clinically resolved.  Monitor.  

## 2023-11-15 ENCOUNTER — Ambulatory Visit: Payer: 59 | Admitting: Family

## 2023-11-15 VITALS — BP 130/83 | HR 103 | Temp 98.9°F | Resp 16 | Ht 60.0 in | Wt 185.0 lb

## 2023-11-15 DIAGNOSIS — Z1339 Encounter for screening examination for other mental health and behavioral disorders: Secondary | ICD-10-CM | POA: Diagnosis not present

## 2023-11-15 DIAGNOSIS — F418 Other specified anxiety disorders: Secondary | ICD-10-CM

## 2023-11-15 MED ORDER — SERTRALINE HCL 50 MG PO TABS: ORAL_TABLET | ORAL | 0 refills | Status: DC

## 2023-11-15 NOTE — Assessment & Plan Note (Addendum)
 Uncontrolled.  No improvement with Cymbalta . Prior trial of Lexapro  resulted in hair loss. Patient's daughter suggested Zoloft  (Sertraline ). I doubt SSRI was likely cause for her hair loss.  I think it is worth trying a different agent. -Discontinue Cymbalta . -Start Sertraline  50mg , half tablet for one week, then full tablet.    Patient acknowledges need for counseling, but has not yet pursued it. -Refer to Dr. Allena for counseling. Pt was given number to call to schedule.

## 2023-11-15 NOTE — Progress Notes (Signed)
 Subjective:     Patient ID: Traci [name], female    DOB: [birth year], 63 y.o.   MRN: [id number]  Chief Complaint  Patient presents with   Depression    Here for follow up   Anxiety    Here for follow up    HPI  Discussed the use of AI scribe software for clinical note transcription with the patient, who gave verbal consent to proceed.  History of Present Illness   The patient, with a history of depression, presents with ongoing struggles with motivation and procrastination. She expresses frustration with her inability to complete tasks in a timely manner, which leads to self-imposed stress. The patient also mentions a strained relationship with her husband, which adds to her stress levels. She has tried various medications in the past, including Lexapro , which she believes caused hair loss, and Cymbalta  (started last visit), which she reports has not made a significant difference in her symptoms. She also brings up the possibility of having adult ADHD, as she has struggled with procrastination since childhood. The patient is currently not working and expresses guilt and stress about spending money without replenishing her account.          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 Maintained on Keppra-no seizure activity since surgery   CHOLECYSTECTOMY  03/2007   lap chole   CYSTOSCOPY  12/04/2012   Procedure: CYSTOSCOPY;  Surgeon: Montie SHAUNNA Chesterfield, MD;  Location: [facility];  Service: Gynecology;  Laterality: N/A;   ROBOTIC ASSISTED TOTAL HYSTERECTOMY  12/04/2012   Procedure: ROBOTIC ASSISTED TOTAL HYSTERECTOMY;  Surgeon: Montie SHAUNNA Chesterfield, MD;  Location: [facility];  Service: Gynecology;  Laterality: Bilateral;  with bilateral salpingectomy    Family History  Problem Relation Age of Onset   Osteoarthritis Mother    Osteoporosis Mother    Hypothyroidism Mother    Hashimoto's thyroiditis Mother    Abdominal Wall Hernia Father    Benign prostatic hyperplasia Father    Atrial fibrillation Father    Heart failure Maternal Grandfather    Heart attack Maternal Grandfather    Cancer Paternal Grandfather        colon cancer   Stroke Paternal Grandmother    Hashimoto's thyroiditis Brother     Social History   Socioeconomic History   Marital status: Married    Spouse name: Ozell   Number of children: Not on file   Years of education: Not on file   Highest education level: Bachelor's degree (e.g., BA, AB, BS)  Occupational History   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    Employer: [employer]  Tobacco Use   Smoking status: Never   Smokeless tobacco: Never  Substance  and Sexual Activity   Alcohol use: Yes    Alcohol/week: 0.0 - 1.0 standard drinks of alcohol   Drug use: No   Sexual activity: Not Currently    Partners: Male    Birth control/protection: Surgical    Comment: hysterectomy  Other Topics Concern   Not on file  Social History Narrative   2 children   Second marriage   Husband is living in [place]   Works at [employer]   Social Drivers of [employer]: Low Risk  (11/15/2023)   Overall Financial Resource Strain (CARDIA)    Difficulty of Paying Living Expenses: Not hard  at all  Food Insecurity: No Food Insecurity (11/15/2023)   Hunger Vital Sign    Worried About Running Out of Food in the Last Year: Never true    Ran Out of Food in the Last Year: Never true  Transportation Needs: No Transportation Needs (11/15/2023)   PRAPARE - Administrator, Civil Service (Medical): No    Lack of Transportation (Non-Medical): No  Physical Activity: Insufficiently Active (11/15/2023)   Exercise Vital Sign    Days of Exercise per Week: 1 day    Minutes of Exercise per Session: 10 min  Stress: Stress Concern Present (11/15/2023)   [employer] of Occupational Health - Occupational Stress Questionnaire    Feeling of Stress : Rather much  Social Connections: Socially Integrated (11/15/2023)   Social Connection and Isolation Panel [NHANES]    Frequency of Communication with Friends and Family: Twice a week    Frequency of Social Gatherings with Friends and Family: Twice a week    Attends Religious Services: More than 4 times per year    Active Member of [employer] or Organizations: Yes    Attends Engineer, Structural: More than 4 times per year    Marital Status: Married  Catering Manager Violence: Not on file    Outpatient Medications Prior to Visit  Medication Sig Dispense Refill   calcium  carbonate (OS-CAL) 600 MG tablet      loratadine  (CLARITIN ) 10 MG tablet Take 10 mg by mouth daily.     Multiple Vitamin (MULTIVITAMIN ADULT PO) Multivitamin     triamcinolone  cream (KENALOG ) 0.5 % APPLY TO AFFECTED AREA TWICE A DAY FOR 7 DAYS     DULoxetine  (CYMBALTA ) 30 MG capsule Take 1 cap by mouth once daily for 3 days, then increase to 2 caps daily. 180 capsule 0   No facility-administered medications prior to visit.    Allergies  Allergen Reactions   Pollen Extract Other (See Comments)    NASAL CONGESTION, SINUS DRAINAGE   Penicillins     childhood   Poison Ivy Extract [Poison Ivy Extract] Hives and Rash    ROS See HPI    Objective:    Physical  Exam Constitutional:      Appearance: Normal appearance.  Neurological:     Mental Status: She is alert.  Psychiatric:        Attention and Perception: Perception normal.        Mood and Affect: Affect normal. Mood is anxious.        Speech: Speech normal.        Behavior: Behavior normal.        Thought Content: Thought content normal.        Cognition and Memory: Cognition and memory normal.        Judgment: Judgment normal.  BP 130/83 (BP Location: Right Arm, Patient Position: Sitting)   Pulse (!) 103   Temp 98.9 F (37.2 C) (Oral)   Resp 16   Ht 5' (1.524 m)   Wt 185 lb (83.9 kg)   LMP 11/29/2012 (Exact Date)   SpO2 99%   BMI 36.13 kg/m  Wt Readings from Last 3 Encounters:  11/15/23 185 lb (83.9 kg)  09/19/23 186 lb (84.4 kg)  08/24/23 185 lb (83.9 kg)       Assessment & Plan:   Problem List Items Addressed This Visit       Unprioritized   Depression with anxiety   Uncontrolled.  No improvement with Cymbalta . Prior trial of Lexapro  resulted in hair loss. Patient's daughter suggested Zoloft  (Sertraline ). I doubt SSRI was likely cause for her hair loss.  I think it is worth trying a different agent. -Discontinue Cymbalta . -Start Sertraline  50mg , half tablet for one week, then full tablet.    Patient acknowledges need for counseling, but has not yet pursued it. -Refer to Dr. Allena for counseling. Pt was given number to call to schedule.       Relevant Medications   sertraline  (ZOLOFT ) 50 MG tablet   Attention deficit hyperactivity disorder (ADHD) evaluation - Primary    Possible Adult ADHD Patient reports lifelong procrastination and difficulty with task completion. -Refer for ADHD testing with a psychologist.        Relevant Orders   Ambulatory referral to Psychology    I have discontinued Jaeanna K. Chinchilla Kim's DULoxetine . I am also having her start on sertraline . Additionally, I am having her maintain her triamcinolone  cream, loratadine ,  calcium  carbonate, and Multiple Vitamin (MULTIVITAMIN ADULT PO).  Meds ordered this encounter  Medications   sertraline  (ZOLOFT ) 50 MG tablet    Sig: 1/2 tab by mouth once daily for 1 week, then increase to a full tablet once daily on week two    Dispense:  60 tablet    Refill:  0    Supervising Provider:   DOMENICA BLACKBIRD A [[id number]]

## 2023-11-15 NOTE — Addendum Note (Signed)
 Addended by: Sandford Craze on: 11/15/2023 02:29 PM   Modules accepted: Orders

## 2023-11-15 NOTE — Patient Instructions (Signed)
 VISIT SUMMARY:  During today's visit, we discussed your ongoing struggles with motivation, procrastination, and stress, particularly in relation to your depression and strained relationship with your husband. We reviewed your past experiences with medications and considered new treatment options. We also explored the possibility of adult ADHD and the need for counseling.  YOUR PLAN:  -DEPRESSION: Depression is a mood disorder that causes persistent feelings of sadness and loss of interest. Since Cymbalta  has not been effective and Lexapro  caused hair loss, we will discontinue Cymbalta  and start you on Sertraline . Begin with half a tablet for one week, then increase to a full tablet.  -POSSIBLE ADULT ADHD: Adult ADHD is a condition that includes symptoms such as difficulty paying attention, hyperactivity, and impulsiveness. Given your lifelong struggles with procrastination and task completion, we will refer you for ADHD testing with a psychologist.  -COUNSELING: Counseling can provide support and strategies to manage stress and improve mental health. We will refer you to Dr. Allena for counseling to help address your current challenges.  INSTRUCTIONS:  Please follow up in 6 weeks to assess your response to Sertraline  and to review your progress with counseling and ADHD testing.

## 2023-11-15 NOTE — Assessment & Plan Note (Signed)
  Possible Adult ADHD Patient reports lifelong procrastination and difficulty with task completion. -Refer for ADHD testing with a psychologist.
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
## 2023-11-17 ENCOUNTER — Encounter: Payer: Self-pay | Admitting: Obstetrics and Gynecology

## 2023-11-17 ENCOUNTER — Ambulatory Visit (INDEPENDENT_AMBULATORY_CARE_PROVIDER_SITE_OTHER): Payer: 59 | Admitting: Obstetrics and Gynecology

## 2023-11-17 VITALS — BP 119/82 | HR 88 | Ht 60.5 in | Wt 185.0 lb

## 2023-11-17 DIAGNOSIS — N393 Stress incontinence (female) (male): Secondary | ICD-10-CM | POA: Diagnosis not present

## 2023-11-17 DIAGNOSIS — R350 Frequency of micturition: Secondary | ICD-10-CM

## 2023-11-17 DIAGNOSIS — N3941 Urge incontinence: Secondary | ICD-10-CM

## 2023-11-17 LAB — POCT URINALYSIS DIPSTICK
Bilirubin, UA: NEGATIVE
Blood, UA: NEGATIVE
Glucose, UA: NEGATIVE
Ketones, UA: NEGATIVE
Leukocytes, UA: NEGATIVE
Nitrite, UA: NEGATIVE
Protein, UA: NEGATIVE
Spec Grav, UA: 1.02 (ref 1.010–1.025)
Urobilinogen, UA: 0.2 U/dL
pH, UA: 6.5 (ref 5.0–8.0)

## 2023-11-17 MED ORDER — TROSPIUM CHLORIDE 20 MG PO TABS: 20.0000 mg | ORAL_TABLET | Freq: Two times a day (BID) | ORAL | 2 refills | Status: DC

## 2023-11-17 NOTE — Patient Instructions (Addendum)
 For urgency and frequency (Trying to make it to the bathroom when you get home): Start Trospium  20mg  once daily.   We will plan for the urethral bulking.   Goal: Drink 5-6 cups of fluid per day (40-64oz)   Today we talked about ways to manage bladder urgency such as altering your diet to avoid irritative beverages and foods (bladder diet) as well as attempting to decrease stress and other exacerbating factors.  You can also chew a plain Tums 1-3 times per day to make your urine less acidic, especially if you have eating/drinking acidic things.   The Most Bothersome Foods* The Least Bothersome Foods*  Coffee - Regular & Decaf Tea - caffeinated Carbonated beverages - cola, non-colas, diet & caffeine-free Alcohols - Beer, Red Wine, White Wine, [address] - Grapefruit, [place], Orange, [employer] - Cranberry, Grapefruit, Orange, Pineapple Vegetables - Tomato & Tomato Products Flavor Enhancers - Hot peppers, Spicy foods, Chili, Horseradish, Vinegar, Monosodium glutamate (MSG) Artificial Sweeteners - NutraSweet, Sweet 'N Low, Equal (sweetener), Saccharin Ethnic foods - Mexican, Thai, Indian food Water  Milk - low-fat & whole Fruits - Bananas, Blueberries, Honeydew melon, Pears, Raisins, Watermelon Vegetables - Broccoli, [address] Sprouts, [place], [place], Cauliflower, [place], Cucumber, Mushrooms, Peas, Radishes, Squash, Zucchini, White potatoes, Sweet potatoes & yams Poultry - Chicken, Eggs, Turkey, [employer] - Beef, [employer], Lamb Seafood - Shrimp, [place] fish, Salmon Grains - Oat, Rice Snacks - Pretzels, Popcorn  *Mitch ALF et al. Diet and its role in interstitial cystitis/bladder pain syndrome (IC/BPS) and comorbid conditions. [employer]. [employer]. 2012 Jan 11.

## 2023-12-12 ENCOUNTER — Telehealth: Payer: Self-pay

## 2023-12-12 NOTE — Telephone Encounter (Signed)
 Patient notified we are not able to know who is in network with her insurance. She was referred to this specific provider due to her age. She was provided with information and phone number for another neuropsycoholgy clinic that does ADHD evaluations. She will call and let us  know if she wants the referral changed.   Copied from CRM [id number]. Topic: Clinical - Request for Lab/Test Order >> Dec 08, 2023  3:36 PM Drema MATSU wrote: Reason for CRM: Patient is having ADHD testing on Tuesday with [facility] and states that it is out of network with [employer]. Patient wants to know if there is any other company that does this type of testing that is in network  so she can get the testing done.

## 2023-12-27 ENCOUNTER — Ambulatory Visit: Payer: 59 | Admitting: Family

## 2023-12-29 ENCOUNTER — Ambulatory Visit: Payer: 59 | Admitting: Family

## 2023-12-29 ENCOUNTER — Encounter: Payer: Self-pay | Admitting: Family

## 2023-12-29 VITALS — BP 125/81 | HR 107 | Temp 98.7°F | Resp 16 | Ht 61.0 in | Wt 187.0 lb

## 2023-12-29 DIAGNOSIS — E785 Hyperlipidemia, unspecified: Secondary | ICD-10-CM

## 2023-12-29 DIAGNOSIS — L309 Dermatitis, unspecified: Secondary | ICD-10-CM

## 2023-12-29 DIAGNOSIS — Z1231 Encounter for screening mammogram for malignant neoplasm of breast: Secondary | ICD-10-CM

## 2023-12-29 DIAGNOSIS — Z780 Asymptomatic menopausal state: Secondary | ICD-10-CM

## 2023-12-29 DIAGNOSIS — Z0000 Encounter for general adult medical examination without abnormal findings: Secondary | ICD-10-CM | POA: Diagnosis not present

## 2023-12-29 DIAGNOSIS — F418 Other specified anxiety disorders: Secondary | ICD-10-CM

## 2023-12-29 DIAGNOSIS — Z23 Encounter for immunization: Secondary | ICD-10-CM

## 2023-12-29 DIAGNOSIS — Z1339 Encounter for screening examination for other mental health and behavioral disorders: Secondary | ICD-10-CM

## 2023-12-29 LAB — COMPREHENSIVE METABOLIC PANEL
ALT: 14 U/L (ref 0–35)
AST: 9 U/L (ref 0–37)
Albumin: 4.1 g/dL (ref 3.5–5.2)
Alkaline Phosphatase: 84 U/L (ref 39–117)
BUN: 15 mg/dL (ref 6–23)
CO2: 30 meq/L (ref 19–32)
Calcium: 8.9 mg/dL (ref 8.4–10.5)
Chloride: 105 meq/L (ref 96–112)
Creatinine, Ser: 0.68 mg/dL (ref 0.40–1.20)
GFR: 92.22 mL/min (ref >60.00)
Glucose, Bld: 101 mg/dL — ABNORMAL HIGH (ref 70–99)
Potassium: 4.1 meq/L (ref 3.5–5.1)
Sodium: 143 meq/L (ref 135–145)
Total Bilirubin: 0.6 mg/dL (ref 0.2–1.2)
Total Protein: 7.1 g/dL (ref 6.0–8.3)

## 2023-12-29 LAB — LIPID PANEL
Cholesterol: 214 mg/dL — ABNORMAL HIGH (ref 0–200)
HDL: 83 mg/dL (ref >39.00)
LDL Cholesterol: 116 mg/dL — ABNORMAL HIGH (ref 0–99)
NonHDL: 131.19
Total CHOL/HDL Ratio: 3
Triglycerides: 76 mg/dL (ref 0.0–149.0)
VLDL: 15.2 mg/dL (ref 0.0–40.0)

## 2023-12-29 MED ORDER — SERTRALINE HCL 100 MG PO TABS: 100.0000 mg | ORAL_TABLET | Freq: Every day | ORAL | 0 refills | Status: DC

## 2023-12-29 MED ORDER — TRIAMCINOLONE ACETONIDE 0.5 % EX CREA: TOPICAL_CREAM | Freq: Two times a day (BID) | CUTANEOUS | 1 refills | Status: AC | PRN

## 2023-12-29 NOTE — Assessment & Plan Note (Signed)
Workup ongoing

## 2023-12-29 NOTE — Progress Notes (Signed)
Subjective:     Patient ID: Traci [name], female    DOB: 1961/09/22, 63 y.o.   MRN: [id number]  Chief Complaint  Patient presents with   Annual Exam    HPI  Discussed the use of AI scribe software for clinical note transcription with the patient, who gave verbal consent to proceed.  History of Present Illness   Traci [name] "Traci [name]" is a 63 year old female who presents with concerns about her mental health and medication management.  She experiences feelings of unproductivity and self-sabotage, particularly during a week without major commitments. She did not enjoy her free time and felt angry at herself for not being productive, though she felt relief after deciding not to follow through with a previous commitment. She is currently taking sertraline (Zoloft) but has not noticed significant improvement in her symptoms and reports no side effects. She has been on various medications for a year without consistent mood improvement and is uncertain about the expected effects of the medication. She has completed an ADHD screening and is scheduled for a follow-up appointment. She has not yet pursued therapy despite interest, acknowledging procrastination in this area.  She has delayed getting her immunizations, including the flu shot, which she plans to receive today. She mentions a recent colonoscopy in October and a bone density test ordered by her gynecologist. She reports dietary challenges, attempting to control portion sizes but is dissatisfied with her weight and is not very active currently, attributing some of this to the cold weather and her drafty house.  She mentions a skin rash that is itchy and bumpy, for which she uses triamcinolone cream as needed, finding it effective.  She is on medication for urinary incontinence, which has provided some improvement. She is considering further treatment options but is weighing the cost implications.  No current cough, cold symptoms, skin rash  other than the one mentioned, swelling in her legs, digestive issues, urinary burning or frequency, unusual muscle or joint pain, and frequent headaches. Her vision is okay with glasses, and her hearing is fine.     Immunizations: shingrix, will do flu shot today.  Diet: trying to eat less quantity.  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 Maintained on Keppra-no seizure activity since surgery   CHOLECYSTECTOMY  03/2007   lap chole   CYSTOSCOPY  12/04/2012   Procedure: CYSTOSCOPY;  Surgeon: Alison Murray, MD;  Location:  [facility];  Service: Gynecology;  Laterality: N/A;   ROBOTIC ASSISTED TOTAL HYSTERECTOMY  12/04/2012   Procedure: ROBOTIC ASSISTED TOTAL HYSTERECTOMY;  Surgeon: Alison Murray, MD;  Location: [facility];  Service: Gynecology;  Laterality: Bilateral;  with bilateral salpingectomy    Family History  Problem Relation Age of Onset   Osteoarthritis Mother    Osteoporosis Mother     Hypothyroidism Mother    Hashimoto's thyroiditis Mother    Abdominal Wall Hernia Father    Benign prostatic hyperplasia Father    Atrial fibrillation Father    Heart failure Maternal Grandfather    Heart attack Maternal Grandfather    Cancer Paternal Grandfather        colon cancer   Stroke Paternal Grandmother    Hashimoto's thyroiditis Brother     Social History   Socioeconomic History   Marital status: Married    Spouse name: Casimiro Needle [place]   Number of children: 2   Years of education: Not on file   Highest education level: Bachelor's degree (e.g., BA, AB, BS)  Occupational History   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: [employer]  Tobacco Use   Smoking status: Never   Smokeless tobacco: Never  Vaping Use   Vaping status: Never Used  Substance and Sexual Activity   Alcohol use: Yes    Alcohol/week: 0.0 - 1.0 standard drinks of alcohol   Drug use: No   Sexual activity: Not Currently    Partners: Male    Birth control/protection: Surgical    Comment: hysterectomy  Other Topics Concern   Not on file  Social History Narrative   2 children   Second marriage   Husband is living in [place]   Works at [employer]   Social Drivers of Long[employer] Drug Stores: Low Risk  (11/15/2023)   Overall Financial Resource Strain (CARDIA)    Difficulty of Paying Living Expenses: Not hard at all  Food Insecurity: No Food Insecurity (11/15/2023)   Hunger Vital Sign    Worried About Running Out of Food in the Last Year: Never true    Ran Out of Food in the Last Year: Never true  Transportation Needs: No Transportation Needs (11/15/2023)   PRAPARE - Administrator, Civil Service (Medical): No    Lack of Transportation (Non-Medical): No  Physical Activity: Insufficiently Active (11/15/2023)   Exercise Vital Sign    Days of Exercise per Week: 1 day    Minutes of Exercise per Session: 10 min  Stress: Stress Concern Present (11/15/2023)    [employer] of Occupational Health - Occupational Stress Questionnaire    Feeling of Stress : Rather much  Social Connections: Socially Integrated (11/15/2023)   Social Connection and Isolation Panel [NHANES]    Frequency of Communication with Friends and Family: Twice a week    Frequency of Social Gatherings with Friends and Family: Twice a week    Attends Religious Services: More than 4 times per year    Active Member of [employer] or Organizations: Yes    Attends Engineer, structural: More than 4 times per year    Marital Status: Married  Catering manager Violence: Not on file    Outpatient Medications Prior to Visit  Medication Sig Dispense Refill   calcium carbonate (OS-CAL) 600 MG tablet      loratadine (CLARITIN) 10 MG tablet Take 10 mg by mouth daily.     Multiple Vitamin (  MULTIVITAMIN ADULT PO) Multivitamin     trospium (SANCTURA) 20 MG tablet Take 1 tablet (20 mg total) by mouth 2 (two) times daily. 60 tablet 2   sertraline (ZOLOFT) 50 MG tablet 1/2 tab by mouth once daily for 1 week, then increase to a full tablet once daily on week two 60 tablet 0   triamcinolone cream (KENALOG) 0.5 % APPLY TO AFFECTED AREA TWICE A DAY FOR 7 DAYS     No facility-administered medications prior to visit.    Allergies  Allergen Reactions   Pollen Extract Other (See Comments)    NASAL CONGESTION, SINUS DRAINAGE   Penicillins     childhood   Poison Ivy Extract [Poison Ivy Extract] Hives and Rash    Review of Systems  Constitutional:  Negative for weight loss.  HENT:  Negative for congestion and hearing loss.   Eyes:  Negative for blurred vision.  Respiratory:  Negative for cough.   Cardiovascular:  Negative for leg swelling.  Gastrointestinal:  Negative for constipation and diarrhea.  Genitourinary:  Negative for dysuria and frequency.  Musculoskeletal:  Negative for joint pain and myalgias.  Skin:  Positive for rash (left forearm, uses triamcinolone).  Neurological:   Negative for headaches.  Psychiatric/Behavioral:         See HPI       Objective:    Physical Exam   BP 125/81 (BP Location: Right Arm, Patient Position: Sitting, Cuff Size: Normal)   Pulse (!) 107   Temp 98.7 F (37.1 C) (Oral)   Resp 16   Ht 5\' 1"  (1.549 m)   Wt 187 lb (84.8 kg)   LMP 11/29/2012 (Exact Date)   SpO2 97%   BMI 35.33 kg/m  Wt Readings from Last 3 Encounters:  12/29/23 187 lb (84.8 kg)  11/17/23 185 lb (83.9 kg)  11/15/23 185 lb (83.9 kg)   Physical Exam  Constitutional: She is oriented to person, place, and time. She appears well-developed and well-nourished. No distress.  HENT:  Head: Normocephalic and atraumatic.  Right Ear: Tympanic membrane and ear canal normal.  Left Ear: Tympanic membrane and ear canal normal.  Mouth/Throat: Oropharynx is clear and moist.  Eyes: Pupils are equal, round, and reactive to light. No scleral icterus.  Neck: Normal range of motion. No thyromegaly present.  Cardiovascular: Normal rate and regular rhythm.   No murmur heard. Pulmonary/Chest: Effort normal and breath sounds normal. No respiratory distress. He has no wheezes. She has no rales. She exhibits no tenderness.  Abdominal: Soft. Bowel sounds are normal. She exhibits no distension and no mass. There is no tenderness. There is no rebound and no guarding.  Musculoskeletal: She exhibits no edema.  Lymphadenopathy:    She has no cervical adenopathy.  Neurological: She is alert and oriented to person, place, and time. She has normal patellar reflexes. She exhibits normal muscle tone. Coordination normal.  Skin: Skin is warm and dry. Eczematous rash noted left forearm Psychiatric: She has a normal mood and affect. Her behavior is normal. Judgment and thought content normal.  Breast/pelvic: deferred           Assessment & Plan:       Assessment & Plan:   Problem List Items Addressed This Visit       Unprioritized   Preventative health care    -Administer  influenza vaccine today. -Order labs for cholesterol and metabolic panel. -Provide referral for mammogram and bone density test to be scheduled after March 19th. -Encourage patient to  schedule vision appointment as it has been approximately 2 years since last exam.      Eczema   Relevant Medications   triamcinolone cream (KENALOG) 0.5 %   Depression with anxiety - Primary   Uncontrolled. Increase sertraline from 50mg  to 100 mg. Pt encouraged to establish with a counselor.       Relevant Medications   sertraline (ZOLOFT) 100 MG tablet   Attention deficit hyperactivity disorder (ADHD) evaluation   Work up on going.       Other Visit Diagnoses       Breast cancer screening by mammogram       Relevant Orders   MM 3D SCREENING MAMMOGRAM BILATERAL BREAST     Postmenopausal estrogen deficiency       Relevant Orders   DG Bone Density     Hyperlipidemia, unspecified hyperlipidemia type       Relevant Orders   Comp Met (CMET)   Lipid panel     Needs flu shot       Relevant Orders   Flu vaccine trivalent PF, 6mos and older(Flulaval,Afluria,Fluarix,Fluzone) (Completed)       I have discontinued Eli Phillips. Traci "Kim"'s sertraline. I have also changed her triamcinolone cream. Additionally, I am having her start on sertraline. Lastly, I am having her maintain her loratadine, calcium carbonate, Multiple Vitamin (MULTIVITAMIN ADULT PO), and trospium.  Meds ordered this encounter  Medications   sertraline (ZOLOFT) 100 MG tablet    Sig: Take 1 tablet (100 mg total) by mouth daily.    Dispense:  90 tablet    Refill:  0    Supervising Provider:   Danise Edge A [[id number]]   triamcinolone cream (KENALOG) 0.5 %    Sig: Apply topically 2 (two) times daily as needed.    Dispense:  30 g    Refill:  1    Supervising Provider:   Danise Edge A [[id number]]

## 2023-12-29 NOTE — Assessment & Plan Note (Signed)
Uncontrolled. Continue triamcinolone.

## 2023-12-29 NOTE — Patient Instructions (Signed)
VISIT SUMMARY:  Today, we discussed your mental health, medication management, and general health maintenance. We addressed your feelings of unproductivity and self-sabotage, your current medication regimen, and your concerns about urinary incontinence and eczema. We also reviewed your recent colonoscopy and bone density test, and discussed your dietary challenges and physical activity levels. Additionally, we administered your flu shot and planned for further routine health screenings.  YOUR PLAN:  -DEPRESSION: Depression is a mood disorder that causes persistent feelings of sadness and loss of interest. We have increased your Sertraline dosage to 100mg  daily and encourage you to seek counseling. We will reassess your condition in 3 months or sooner if needed.  -ADHD: Attention-Deficit/Hyperactivity Disorder (ADHD) is a condition characterized by inattention, hyperactivity, and impulsiveness. We will review your screening results and consider treatment options once they are available.  -URINARY INCONTINENCE: Urinary incontinence is the loss of bladder control. You have started medication with slight improvement. Continue your current medication, and we will consider further treatment options once you are on Medicare.  -ECZEMA: Eczema is a condition that makes your skin red and itchy. We have renewed your prescription for Triamcinolone cream. Continue using it as needed.  -GENERAL HEALTH MAINTENANCE: We administered your influenza vaccine today. We have ordered labs for cholesterol and a metabolic panel, and provided referrals for a mammogram and bone density test to be scheduled after March 19th. Please schedule a vision appointment as it has been approximately 2 years since your last exam.  INSTRUCTIONS:  Please follow up in 3 months for a reassessment of your depression or sooner if your condition worsens. Schedule your mammogram and bone density test after March 19th, and make an appointment  for a vision exam as soon as possible.

## 2023-12-29 NOTE — Assessment & Plan Note (Signed)
-  Administer influenza vaccine today. -Order labs for cholesterol and metabolic panel. -Provide referral for mammogram and bone density test to be scheduled after March 19th. -Encourage patient to schedule vision appointment as it has been approximately 2 years since last exam.

## 2023-12-29 NOTE — Assessment & Plan Note (Signed)
Uncontrolled. Increase sertraline from 50mg  to 100 mg. Pt encouraged to establish with a counselor.

## 2024-01-07 ENCOUNTER — Telehealth: Payer: Self-pay | Admitting: Family

## 2024-01-07 NOTE — Telephone Encounter (Signed)
 I reviewed her formal ADHD evaluation.  If she would like to schedule a follow up visit to discuss medication treatment for ADHD, she is welcome to.

## 2024-01-09 NOTE — Telephone Encounter (Signed)
 Called patient but no answer, left voice mail for patient to call back.  She will need follow up appointment to discuss treatment

## 2024-01-11 NOTE — Telephone Encounter (Signed)
 Patient notified and scheduled to come in 01/23/24

## 2024-01-23 ENCOUNTER — Ambulatory Visit (INDEPENDENT_AMBULATORY_CARE_PROVIDER_SITE_OTHER): Admitting: Family

## 2024-01-23 VITALS — BP 130/76 | HR 91 | Temp 98.6°F | Resp 16 | Ht 61.0 in | Wt 187.0 lb

## 2024-01-23 DIAGNOSIS — F909 Attention-deficit hyperactivity disorder, unspecified type: Secondary | ICD-10-CM | POA: Insufficient documentation

## 2024-01-23 MED ORDER — AMPHETAMINE-DEXTROAMPHET ER 10 MG PO CP24: 10.0000 mg | ORAL_CAPSULE | Freq: Every day | ORAL | 0 refills | Status: DC

## 2024-01-23 NOTE — Assessment & Plan Note (Signed)
 New diagnosis. Trial of adderall 10mg  XL once daily. Controlled substance contract is signed and will obtain UDS.

## 2024-01-23 NOTE — Progress Notes (Unsigned)
 Subjective:     Patient ID: Traci [name], female    DOB: October 07, 1961, 63 y.o.   MRN: [id number]  Chief Complaint  Patient presents with   ADHD    Here to discuss recent evaluation for ADHA    HPI  Discussed the use of AI scribe software for clinical note transcription with the patient, who gave verbal consent to proceed.  History of Present Illness   Traci [name] "Traci [name]" is a 63 year old female who presents with focus and productivity issues related to ADHD. She had a recent ADHD evaluation which indicated that she has ADHD and may benefit from medication therapy. She experiences ongoing difficulties with focus and productivity, which have been attributed to ADHD. She is not currently working outside the home but finds herself unable to complete tasks at home as needed. She describes feeling relieved upon completing tasks, but initiating and maintaining focus on these tasks remains a significant challenge. There is a history of depression being considered as a contributing factor to her focus issues. However, with the recent diagnosis of ADHD, the focus has shifted to addressing these symptoms. She is not currently on any ADHD medication.     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 Maintained on Keppra-no seizure activity since surgery   CHOLECYSTECTOMY  03/2007   lap chole   CYSTOSCOPY  12/04/2012   Procedure: CYSTOSCOPY;  Surgeon: Alison Murray, MD;  Location: [facility];  Service: Gynecology;  Laterality: N/A;   ROBOTIC ASSISTED TOTAL HYSTERECTOMY  12/04/2012   Procedure: ROBOTIC ASSISTED TOTAL HYSTERECTOMY;  Surgeon: Alison Murray, MD;  Location: [facility];  Service: Gynecology;  Laterality: Bilateral;  with bilateral salpingectomy    Family History  Problem Relation Age of Onset   Osteoarthritis Mother    Osteoporosis Mother    Hypothyroidism Mother    Hashimoto's thyroiditis Mother    Abdominal Wall Hernia Father    Benign prostatic hyperplasia Father    Atrial fibrillation Father    Heart failure Maternal Grandfather    Heart attack Maternal Grandfather    Cancer Paternal Grandfather        colon cancer   Stroke Paternal Grandmother    Hashimoto's thyroiditis Brother     Social History   Socioeconomic History   Marital status: Married    Spouse name: Casimiro Needle [place]   Number of children: 2   Years of education: Not on file   Highest education level: Bachelor's degree (e.g., BA, AB, BS)  Occupational History   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    Employer: [employer]  Tobacco Use   Smoking status: Never   Smokeless tobacco: Never  Vaping Use  Vaping status: Never Used  Substance and Sexual Activity   Alcohol use: Yes    Alcohol/week: 0.0 - 1.0 standard drinks of alcohol   Drug use: No   Sexual activity: Not Currently    Partners: Male    Birth control/protection: Surgical    Comment: hysterectomy  Other Topics Concern   Not on file  Social History Narrative   2 children   Second marriage   Husband is living in [place]   Works at [employer]   Social Drivers of [employer]: Low Risk  (11/15/2023)   Overall Financial Resource Strain (CARDIA)     Difficulty of Paying Living Expenses: Not hard at all  Food Insecurity: No Food Insecurity (11/15/2023)   Hunger Vital Sign    Worried About Running Out of Food in the Last Year: Never true    Ran Out of Food in the Last Year: Never true  Transportation Needs: No Transportation Needs (11/15/2023)   PRAPARE - Administrator, Civil Service (Medical): No    Lack of Transportation (Non-Medical): No  Physical Activity: Insufficiently Active (11/15/2023)   Exercise Vital Sign    Days of Exercise per Week: 1 day    Minutes of Exercise per Session: 10 min  Stress: Stress Concern Present (11/15/2023)   [employer] of Occupational Health - Occupational Stress Questionnaire    Feeling of Stress : Rather much  Social Connections: Socially Integrated (11/15/2023)   Social Connection and Isolation Panel [NHANES]    Frequency of Communication with Friends and Family: Twice a week    Frequency of Social Gatherings with Friends and Family: Twice a week    Attends Religious Services: More than 4 times per year    Active Member of [employer] or Organizations: Yes    Attends Engineer, structural: More than 4 times per year    Marital Status: Married  Catering manager Violence: Not on file    Outpatient Medications Prior to Visit  Medication Sig Dispense Refill   calcium carbonate (OS-CAL) 600 MG tablet      loratadine (CLARITIN) 10 MG tablet Take 10 mg by mouth daily.     Multiple Vitamin (MULTIVITAMIN ADULT PO) Multivitamin     sertraline (ZOLOFT) 100 MG tablet Take 1 tablet (100 mg total) by mouth daily. 90 tablet 0   triamcinolone cream (KENALOG) 0.5 % Apply topically 2 (two) times daily as needed. 30 g 1   trospium (SANCTURA) 20 MG tablet Take 1 tablet (20 mg total) by mouth 2 (two) times daily. 60 tablet 2   No facility-administered medications prior to visit.    Allergies  Allergen Reactions   Pollen Extract Other (See Comments)    NASAL CONGESTION, SINUS DRAINAGE    Penicillins     childhood   Poison Ivy Extract [Poison Ivy Extract] Hives and Rash    ROS    See HPI Objective:    Physical Exam Constitutional:      Appearance: Normal appearance.  HENT:     Head: Normocephalic and atraumatic.  Eyes:     Extraocular Movements: Extraocular movements intact.  Neurological:     Mental Status: She is alert.  Psychiatric:        Mood and Affect: Mood normal.        Behavior: Behavior normal.        Thought Content: Thought content normal.        Judgment: Judgment normal.  BP 130/76 (BP Location: Right Arm, Patient Position: Sitting, Cuff Size: Large)   Pulse 91   Temp 98.6 F (37 C) (Oral)   Resp 16   Ht 5\' 1"  (1.549 m)   Wt 187 lb (84.8 kg)   LMP 11/29/2012 (Exact Date)   SpO2 98%   BMI 35.33 kg/m  Wt Readings from Last 3 Encounters:  01/23/24 187 lb (84.8 kg)  12/29/23 187 lb (84.8 kg)  11/17/23 185 lb (83.9 kg)       Assessment & Plan:   Problem List Items Addressed This Visit       Unprioritized   Attention deficit hyperactivity disorder (ADHD) - Primary   New diagnosis. Trial of adderall 10mg  XL once daily. Controlled substance contract is signed and will obtain UDS.       Relevant Medications   amphetamine-dextroamphetamine (ADDERALL XR) 10 MG 24 hr capsule   Other Relevant Orders   DRUG MONITORING, PANEL 8 WITH CONFIRMATION, URINE    I am having Tallia Moehring. [place] "Traci [name]" start on amphetamine-dextroamphetamine. I am also having her maintain her loratadine, calcium carbonate, Multiple Vitamin (MULTIVITAMIN ADULT PO), trospium, sertraline, and triamcinolone cream.  Meds ordered this encounter  Medications   amphetamine-dextroamphetamine (ADDERALL XR) 10 MG 24 hr capsule    Sig: Take 1 capsule (10 mg total) by mouth daily.    Dispense:  30 capsule    Refill:  0    Supervising Provider:   Danise Edge A [[id number]]

## 2024-01-23 NOTE — Patient Instructions (Signed)
 VISIT SUMMARY:  Today, we discussed your ongoing difficulties with focus and productivity, which have been attributed to ADHD. We reviewed your symptoms and history, and we have decided to start a medication regimen to help manage your ADHD symptoms.  YOUR PLAN:  -ATTENTION-DEFICIT/HYPERACTIVITY DISORDER (ADHD): ADHD is a condition that affects focus, attention, and task completion. To help manage your symptoms, we will start you on Adderall 10 mg extended-release in the morning. If this dose is not effective, we may increase it to 20 mg. Additionally, if you find that your focus diminishes in the afternoon, we can add a 5 mg short-acting dose. You will need to sign a controlled substance contract and undergo an annual urine drug screening. We will schedule follow-up appointments every three months to monitor your progress. Your prescription has been sent to [facility].  INSTRUCTIONS:  Please follow up every three months to monitor your progress with the medication.

## 2024-01-24 LAB — DRUG MONITORING, PANEL 8 WITH CONFIRMATION, URINE
6 Acetylmorphine: NEGATIVE ng/mL (ref <10)
Alcohol Metabolites: NEGATIVE ng/mL (ref <500)
Amphetamines: NEGATIVE ng/mL (ref <500)
Benzodiazepines: NEGATIVE ng/mL (ref <100)
Buprenorphine, Urine: NEGATIVE ng/mL (ref <5)
Cocaine Metabolite: NEGATIVE ng/mL (ref <150)
Creatinine: 244.4 mg/dL (ref >=20.0)
MDMA: NEGATIVE ng/mL (ref <500)
Marijuana Metabolite: NEGATIVE ng/mL (ref <20)
Opiates: NEGATIVE ng/mL (ref <100)
Oxidant: NEGATIVE ug/mL (ref <200)
Oxycodone: NEGATIVE ng/mL (ref <100)
pH: 5.4 (ref 4.5–9.0)

## 2024-01-24 LAB — DM TEMPLATE

## 2024-01-25 ENCOUNTER — Other Ambulatory Visit (HOSPITAL_BASED_OUTPATIENT_CLINIC_OR_DEPARTMENT_OTHER): Payer: 59

## 2024-01-29 ENCOUNTER — Encounter (HOSPITAL_BASED_OUTPATIENT_CLINIC_OR_DEPARTMENT_OTHER): Payer: Self-pay

## 2024-01-29 ENCOUNTER — Ambulatory Visit (HOSPITAL_BASED_OUTPATIENT_CLINIC_OR_DEPARTMENT_OTHER)
Admission: RE | Admit: 2024-01-29 | Discharge: 2024-01-29 | Disposition: A | Discharge status: Home / Self Care | Payer: 59 | Source: Ambulatory Visit | Attending: Family | Admitting: Family

## 2024-01-29 ENCOUNTER — Encounter: Payer: Self-pay | Admitting: Family

## 2024-01-29 DIAGNOSIS — Z780 Asymptomatic menopausal state: Secondary | ICD-10-CM | POA: Insufficient documentation

## 2024-01-29 DIAGNOSIS — Z1231 Encounter for screening mammogram for malignant neoplasm of breast: Secondary | ICD-10-CM | POA: Insufficient documentation

## 2024-01-31 ENCOUNTER — Other Ambulatory Visit: Payer: Self-pay | Admitting: Family

## 2024-01-31 DIAGNOSIS — R928 Other abnormal and inconclusive findings on diagnostic imaging of breast: Secondary | ICD-10-CM

## 2024-02-13 ENCOUNTER — Ambulatory Visit (INDEPENDENT_AMBULATORY_CARE_PROVIDER_SITE_OTHER): Admitting: Family

## 2024-02-13 ENCOUNTER — Other Ambulatory Visit: Payer: Self-pay | Admitting: Obstetrics and Gynecology

## 2024-02-13 VITALS — BP 118/74 | HR 100 | Temp 98.6°F | Resp 16 | Ht 61.0 in | Wt 188.2 lb

## 2024-02-13 DIAGNOSIS — F909 Attention-deficit hyperactivity disorder, unspecified type: Secondary | ICD-10-CM

## 2024-02-13 DIAGNOSIS — R1110 Vomiting, unspecified: Secondary | ICD-10-CM | POA: Insufficient documentation

## 2024-02-13 MED ORDER — AMPHETAMINE-DEXTROAMPHET ER 20 MG PO CP24: 20.0000 mg | ORAL_CAPSULE | ORAL | 0 refills | Status: DC

## 2024-02-13 NOTE — Assessment & Plan Note (Signed)
  Experiences occasional bile vomiting, approximately twice monthly. No abdominal pain or acid reflux. Prefers symptom monitoring before medication initiation. - Monitor symptoms and document frequency and triggers of vomiting. - Consider antacid medication if symptoms persist. - Reassess in one month.

## 2024-02-13 NOTE — Assessment & Plan Note (Signed)
  Adderall XR 10 mg initiated with no significant symptom change. Plan to increase to 20 mg for improved symptom control. No side effects reported; sleep and appetite remain normal. - Increase Adderall XR to 20 mg. - Instruct to take two 10 mg tablets until depleted, then switch to 20 mg tablets. - Send prescription to [facility]. - Follow up in one month to assess response to increased dose.

## 2024-02-13 NOTE — Patient Instructions (Signed)
 VISIT SUMMARY:  During today's visit, we discussed your current treatment for ADHD and your experience with occasional morning vomiting. We also talked about your upcoming Medicare eligibility and its potential impact on your healthcare costs.  YOUR PLAN:  -ATTENTION-DEFICIT/HYPERACTIVITY DISORDER (ADHD): ADHD is a condition characterized by symptoms such as inattention, hyperactivity, and impulsiveness. You have been taking Adderall XR 10 mg without significant improvement. We will increase your dose to 20 mg to help better manage your symptoms. Please take two 10 mg tablets until your current supply runs out, then switch to the 20 mg tablets. Your prescription has been sent to [facility]. We will follow up in one month to see how you are responding to the increased dose.  -Occasional Vomitting- You experience occasional bile vomiting, about twice a month, without abdominal pain or acid reflux. We will monitor your symptoms and document their frequency and triggers. If the symptoms persist, we may consider starting you on an antacid medication or sending you to see a GI specialist. We will reassess this in one month.  -GENERAL HEALTH MAINTENANCE: We discussed your upcoming Medicare eligibility and how it might reduce your out-of-pocket expenses for future treatments, including vaginal and urinary issues. Please review your Medicare coverage options to understand the potential benefits.

## 2024-02-13 NOTE — Progress Notes (Signed)
 Subjective:     Patient ID: Traci [name], female    DOB: 1961/05/06, 63 y.o.   MRN: [id number]  Chief Complaint  Patient presents with   ADHD    3 wk f/u, patient reports no improvement with medication    HPI  Discussed the use of AI scribe software for clinical note transcription with the patient, who gave verbal consent to proceed.  History of Present Illness  Traci [name] "[name]" is a 63 year old female who presents with lack of improvement on Adderall XR 10 mg.  She has been taking Adderall XR 10 mg for ADHD without significant improvement in symptoms. No side effects such as sleep disturbances or appetite changes. She plans to increase the dose to 20 mg once her current supply runs out.  She experiences occasional morning vomiting, about twice a month, sometimes on consecutive days, involving bile. No acid reflux or abdominal pain. She has no gallbladder and attributes her frequent bowel movements, typically two to three times a day, to its removal. After eating or drinking in the morning, she often needs another bowel movement, which can be loose or softer.  She is considering her healthcare options as she approaches Medicare eligibility, discussing financial considerations and coverage for potential future treatments.     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 Maintained on Keppra-no seizure activity since surgery   CHOLECYSTECTOMY  03/2007   lap chole   CYSTOSCOPY  12/04/2012   Procedure: CYSTOSCOPY;  Surgeon: Alison Murray, MD;  Location: [facility];  Service: Gynecology;  Laterality: N/A;   ROBOTIC ASSISTED TOTAL HYSTERECTOMY  12/04/2012   Procedure: ROBOTIC ASSISTED TOTAL HYSTERECTOMY;  Surgeon: Alison Murray, MD;  Location: [facility];  Service: Gynecology;  Laterality: Bilateral;  with bilateral salpingectomy    Family History  Problem Relation Age of Onset   Osteoarthritis Mother    Osteoporosis Mother    Hypothyroidism Mother    Hashimoto's thyroiditis Mother    Abdominal Wall Hernia Father    Benign prostatic hyperplasia Father    Atrial fibrillation Father    Heart failure Maternal Grandfather    Heart attack Maternal Grandfather    Cancer Paternal Grandfather        colon cancer   Stroke Paternal Grandmother    Hashimoto's thyroiditis Brother     Social History   Socioeconomic History   Marital status: Married    Spouse name: Casimiro Needle Kraeger   Number of children: 2   Years of education: Not on file   Highest education level: Bachelor's degree (e.g., BA, AB, BS)  Occupational History   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: [employer]  Tobacco Use   Smoking status: Never   Smokeless tobacco: Never  Vaping Use   Vaping status: Never Used  Substance and Sexual Activity   Alcohol use: Yes    Alcohol/week: 0.0 - 1.0 standard drinks of alcohol   Drug use: No   Sexual activity: Not Currently    Partners: Male    Birth control/protection: Surgical    Comment: hysterectomy  Other Topics Concern   Not on file  Social History Narrative   2 children   Second marriage   Husband is living in [place]   Works at [employer]   Social Drivers of [employer]: Low Risk  (11/15/2023)   Overall Financial Resource Strain (CARDIA)    Difficulty of Paying Living Expenses: Not hard at all  Food Insecurity: No Food Insecurity (11/15/2023)   Hunger Vital Sign    Worried About Running Out of Food in the Last Year: Never true    Ran Out of Food in the Last Year: Never true  Transportation Needs: No Transportation Needs (11/15/2023)   PRAPARE - Administrator, Civil Service (Medical): No    Lack of Transportation (Non-Medical): No  Physical Activity: Insufficiently Active (11/15/2023)   Exercise Vital Sign    Days of Exercise per Week: 1 day    Minutes of Exercise per Session: 10 min  Stress: Stress Concern Present (11/15/2023)   [employer] of Occupational Health - Occupational Stress Questionnaire    Feeling of Stress : Rather much  Social Connections: Socially Integrated (11/15/2023)   Social Connection and Isolation Panel [NHANES]    Frequency of Communication with Friends and Family: Twice a week    Frequency of Social Gatherings with Friends and Family: Twice a week    Attends Religious Services: More than 4 times per year    Active Member of [employer] or Organizations: Yes    Attends Engineer, structural: More than 4 times per year    Marital Status: Married  Catering manager Violence: Not on file    Outpatient Medications Prior to Visit  Medication Sig Dispense Refill   calcium carbonate (OS-CAL) 600 MG tablet      loratadine (CLARITIN) 10 MG tablet Take 10 mg by mouth daily.     Multiple Vitamin (MULTIVITAMIN ADULT PO) Multivitamin     sertraline (ZOLOFT) 100 MG tablet Take 1 tablet (100 mg total) by mouth daily. 90 tablet 0   triamcinolone cream (KENALOG) 0.5 % Apply topically 2 (two) times daily as needed. 30 g 1   trospium (SANCTURA) 20 MG tablet Take 1 tablet (20 mg total) by mouth 2 (two) times daily. 60 tablet 2   amphetamine-dextroamphetamine (ADDERALL XR) 10 MG 24 hr capsule Take 1 capsule  (10 mg total) by mouth daily. 30 capsule 0   No facility-administered medications prior to visit.    Allergies  Allergen Reactions   Pollen Extract Other (See Comments)    NASAL CONGESTION, SINUS DRAINAGE   Penicillins     childhood   Poison Ivy Extract [Poison Ivy Extract] Hives and Rash    ROS    See HPI Objective:    Physical Exam Constitutional:      General: She is not in acute distress.    Appearance: Normal appearance. She is well-developed.  HENT:     Head: Normocephalic and atraumatic.     Right Ear: External ear normal.     Left Ear: External ear normal.  Eyes:     General: No scleral icterus. Neck:     Thyroid: No thyromegaly.  Cardiovascular:     Rate and Rhythm: Normal rate and regular rhythm.     Heart sounds: Normal heart sounds. No murmur heard. Pulmonary:     Effort: Pulmonary effort is normal. No respiratory distress.     Breath sounds: Normal breath sounds. No wheezing.  Abdominal:     General: Bowel sounds are normal.     Palpations: Abdomen is soft.     Tenderness: There is no abdominal tenderness.  Musculoskeletal:     Cervical back: Neck supple.  Skin:    General: Skin is warm and dry.  Neurological:     Mental Status: She is alert and oriented to person, place, and time.  Psychiatric:        Mood and Affect: Mood normal.        Behavior: Behavior normal.        Thought Content: Thought content normal.        Judgment: Judgment normal.      BP 118/74 (BP Location: Right Arm, Cuff Size: Large)   Pulse 100   Temp 98.6 F (37 C) (Oral)   Resp 16   Ht 5\' 1"  (1.549 m)   Wt 188 lb 3.2 oz (85.4 kg)   LMP 11/29/2012 (Exact Date)   SpO2 99%   BMI 35.56 kg/m  Wt Readings from Last 3 Encounters:  02/13/24 188 lb 3.2 oz (85.4 kg)  01/23/24 187 lb (84.8 kg)  12/29/23 187 lb (84.8 kg)       Assessment & Plan:   Problem List Items Addressed This Visit       Unprioritized   Intermittent vomiting    Experiences occasional bile  vomiting, approximately twice monthly. No abdominal pain or acid reflux. Prefers symptom monitoring before medication initiation. - Monitor symptoms and document frequency and triggers of vomiting. - Consider antacid medication if symptoms persist. - Reassess in one month.      Attention deficit hyperactivity disorder (ADHD) - Primary    Adderall XR 10 mg initiated with no significant symptom change. Plan to increase to 20 mg for improved symptom control. No side effects reported; sleep and appetite remain normal. - Increase Adderall XR to 20 mg. - Instruct to take two 10 mg tablets until depleted, then switch to 20 mg tablets. - Send prescription to [facility]. - Follow up in one month to assess response to increased dose.      Relevant Medications   amphetamine-dextroamphetamine (ADDERALL XR) 20 MG 24 hr capsule    I have discontinued Dasiah Hooley. Cade "Kim"'s amphetamine-dextroamphetamine. I am also having her start on amphetamine-dextroamphetamine. Additionally, I am having her maintain her loratadine, calcium carbonate, Multiple Vitamin (MULTIVITAMIN ADULT PO), trospium, sertraline, and triamcinolone cream.  Meds ordered this encounter  Medications   amphetamine-dextroamphetamine (ADDERALL XR) 20 MG 24 hr capsule    Sig: Take 1 capsule (20 mg total) by mouth every morning.    Dispense:  30 capsule    Refill:  0    Supervising Provider:   Danise Edge A [[id number]]

## 2024-02-14 ENCOUNTER — Ambulatory Visit

## 2024-02-14 ENCOUNTER — Ambulatory Visit
Admission: RE | Admit: 2024-02-14 | Discharge: 2024-02-14 | Disposition: A | Discharge status: Home / Self Care | Source: Ambulatory Visit | Attending: Family | Admitting: Family

## 2024-02-14 DIAGNOSIS — R928 Other abnormal and inconclusive findings on diagnostic imaging of breast: Secondary | ICD-10-CM

## 2024-03-18 ENCOUNTER — Ambulatory Visit: Admitting: Nurse Practitioner

## 2024-03-19 ENCOUNTER — Ambulatory Visit (INDEPENDENT_AMBULATORY_CARE_PROVIDER_SITE_OTHER): Admitting: Family

## 2024-03-19 VITALS — BP 147/80 | HR 97 | Resp 16 | Ht 61.0 in | Wt 190.0 lb

## 2024-03-19 DIAGNOSIS — E669 Obesity, unspecified: Secondary | ICD-10-CM | POA: Diagnosis not present

## 2024-03-19 DIAGNOSIS — F418 Other specified anxiety disorders: Secondary | ICD-10-CM | POA: Diagnosis not present

## 2024-03-19 DIAGNOSIS — F909 Attention-deficit hyperactivity disorder, unspecified type: Secondary | ICD-10-CM

## 2024-03-19 MED ORDER — AMPHETAMINE-DEXTROAMPHET ER 30 MG PO CP24: 30.0000 mg | ORAL_CAPSULE | Freq: Every day | ORAL | 0 refills | Status: DC

## 2024-03-19 NOTE — Assessment & Plan Note (Signed)
  Feels calmer on current medication but struggles with motivation and task initiation, possibly related to depression. Discussed counseling benefits. She is willing to pursue counseling. - Refer to counseling services for further evaluation and support.

## 2024-03-19 NOTE — Assessment & Plan Note (Signed)
  Discussed weight concerns and health impact. Acknowledges need for lifestyle changes, including diet and exercise. Discussed setting small, achievable goals and potential mutual support with partner. - Encourage setting small, achievable dietary and exercise goals. - Discuss potential lifestyle changes with partner to support mutual health goals.

## 2024-03-19 NOTE — Assessment & Plan Note (Signed)
 Continues to have difficulty initiating tasks on Adderall XR 20 mg. Some improvement in calmness and mood noted. Considering dose increase to 30 mg, typical range 20-30 mg. Discussed potential increased side effects with higher dose. She is open to dose adjustment and will monitor for adverse effects. - Increase Adderall XR to 30 mg once daily. - Provide pamphlet for counseling services. - Encourage follow-up with a counselor to address underlying issues affecting task initiation.

## 2024-03-19 NOTE — Progress Notes (Signed)
 Subjective:     Patient ID: Traci [name], female    DOB: November 30, 1960, 63 y.o.   MRN: [id number]  Chief Complaint  Patient presents with   ADHD    Here for follow up   Depression    Here for follow up     Depression         Discussed the use of AI scribe software for clinical note transcription with the patient, who gave verbal consent to proceed.  History of Present Illness  Traci [name] "Traci [name]" is a 63 year old female who presents for a follow-up on medication management for ADHD.  Adderall XR 20 mg has not significantly improved her ability to initiate tasks or maintain focus, although she feels calmer and less depressed. She continues to experience procrastination and difficulty completing tasks, which she attributes to ADHD. She is considering counseling to address procrastination and lack of motivation.  She expresses concerns about her weight, acknowledging a lack of physical activity and suboptimal dietary habits. She desires to adopt healthier habits to improve her comfort and health. She reflects on past success with weight loss and reduced cravings when committed to healthier eating.     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  Maintained on Keppra-no seizure activity since surgery   CHOLECYSTECTOMY  03/2007   lap chole   CYSTOSCOPY  12/04/2012   Procedure: CYSTOSCOPY;  Surgeon: Doren Gammons, MD;  Location: [facility];  Service: Gynecology;  Laterality: N/A;   ROBOTIC ASSISTED TOTAL HYSTERECTOMY  12/04/2012   Procedure: ROBOTIC ASSISTED TOTAL HYSTERECTOMY;  Surgeon: Doren Gammons, MD;  Location: [facility];  Service: Gynecology;  Laterality: Bilateral;  with bilateral salpingectomy    Family History  Problem Relation Age of Onset   Osteoarthritis Mother    Osteoporosis Mother    Hypothyroidism Mother    Hashimoto's thyroiditis Mother    Abdominal Wall Hernia Father    Benign prostatic hyperplasia Father    Atrial fibrillation Father    Heart failure Maternal Grandfather    Heart attack Maternal Grandfather    Cancer Paternal Grandfather        colon cancer   Stroke Paternal Grandmother    Hashimoto's thyroiditis Brother     Social History   Socioeconomic History   Marital status: Married    Spouse name: Bambi Lever Schofield   Number of children: 2   Years of education: Not on file   Highest education level: Bachelor's degree (e.g., BA, AB, BS)  Occupational History   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    Employer: [employer]  Tobacco Use   Smoking status: Never   Smokeless tobacco: Never  Vaping Use  Vaping status: Never Used  Substance and Sexual Activity   Alcohol use: Yes    Alcohol/week: 0.0 - 1.0 standard drinks of alcohol   Drug use: No   Sexual activity: Not Currently    Partners: Male    Birth control/protection: Surgical    Comment: hysterectomy  Other Topics Concern   Not on file  Social History Narrative   2 children   Second marriage   Husband is living in [place]   Works at [employer]   Social Drivers of [employer]: Low Risk  (11/15/2023)   Overall Financial Resource Strain (CARDIA)    Difficulty of Paying Living Expenses: Not  hard at all  Food Insecurity: No Food Insecurity (11/15/2023)   Hunger Vital Sign    Worried About Running Out of Food in the Last Year: Never true    Ran Out of Food in the Last Year: Never true  Transportation Needs: No Transportation Needs (11/15/2023)   PRAPARE - Administrator, Civil Service (Medical): No    Lack of Transportation (Non-Medical): No  Physical Activity: Insufficiently Active (11/15/2023)   Exercise Vital Sign    Days of Exercise per Week: 1 day    Minutes of Exercise per Session: 10 min  Stress: Stress Concern Present (11/15/2023)   [employer] of Occupational Health - Occupational Stress Questionnaire    Feeling of Stress : Rather much  Social Connections: Socially Integrated (11/15/2023)   Social Connection and Isolation Panel [NHANES]    Frequency of Communication with Friends and Family: Twice a week    Frequency of Social Gatherings with Friends and Family: Twice a week    Attends Religious Services: More than 4 times per year    Active Member of [employer] or Organizations: Yes    Attends Engineer, structural: More than 4 times per year    Marital Status: Married  Catering manager Violence: Not on file    Outpatient Medications Prior to Visit  Medication Sig Dispense Refill   calcium  carbonate (OS-CAL) 600 MG tablet      loratadine  (CLARITIN ) 10 MG tablet Take 10 mg by mouth daily.     Multiple Vitamin (MULTIVITAMIN ADULT PO) Multivitamin     sertraline  (ZOLOFT ) 100 MG tablet Take 1 tablet (100 mg total) by mouth daily. 90 tablet 0   triamcinolone  cream (KENALOG ) 0.5 % Apply topically 2 (two) times daily as needed. 30 g 1   trospium  (SANCTURA ) 20 MG tablet TAKE 1 TABLET BY MOUTH TWICE A DAY 60 tablet 2   amphetamine -dextroamphetamine (ADDERALL XR) 20 MG 24 hr capsule Take 1 capsule (20 mg total) by mouth every morning. 30 capsule 0   No facility-administered medications prior to visit.    Allergies  Allergen Reactions   Pollen Extract  Other (See Comments)    NASAL CONGESTION, SINUS DRAINAGE   Penicillins     childhood   Poison Ivy Extract [Poison Ivy Extract] Hives and Rash    Review of Systems  Psychiatric/Behavioral:  Positive for depression.        Objective:     Physical Exam Constitutional:      Appearance: Normal appearance.  HENT:     Head: Normocephalic.  Cardiovascular:     Rate and Rhythm: Normal rate.  Pulmonary:     Effort: Pulmonary effort is normal.  Skin:    General: Skin is dry.  Neurological:     General: No focal deficit present.  Mental Status: She is alert and oriented to person, place, and time.      BP (!) 147/80 (BP Location: Right Arm, Patient Position: Sitting, Cuff Size: Normal)   Pulse 97   Resp 16   Ht 5\' 1"  (1.549 m)   Wt 190 lb (86.2 kg)   LMP 11/29/2012 (Exact Date)   SpO2 97%   BMI 35.90 kg/m  Wt Readings from Last 3 Encounters:  03/19/24 190 lb (86.2 kg)  02/13/24 188 lb 3.2 oz (85.4 kg)  01/23/24 187 lb (84.8 kg)       Assessment & Plan:   Problem List Items Addressed This Visit       Unprioritized   Obesity (BMI 35.0-39.9 without comorbidity)    Discussed weight concerns and health impact. Acknowledges need for lifestyle changes, including diet and exercise. Discussed setting small, achievable goals and potential mutual support with partner. - Encourage setting small, achievable dietary and exercise goals. - Discuss potential lifestyle changes with partner to support mutual health goals.      Relevant Medications   amphetamine -dextroamphetamine (ADDERALL XR) 30 MG 24 hr capsule   Depression with anxiety    Feels calmer on current medication but struggles with motivation and task initiation, possibly related to depression. Discussed counseling benefits. She is willing to pursue counseling. - Refer to counseling services for further evaluation and support.      Attention deficit hyperactivity disorder (ADHD) - Primary   Continues to have  difficulty initiating tasks on Adderall XR 20 mg. Some improvement in calmness and mood noted. Considering dose increase to 30 mg, typical range 20-30 mg. Discussed potential increased side effects with higher dose. She is open to dose adjustment and will monitor for adverse effects. - Increase Adderall XR to 30 mg once daily. - Provide pamphlet for counseling services. - Encourage follow-up with a counselor to address underlying issues affecting task initiation.       I have discontinued Klover K. Faddis "Kim"'s amphetamine -dextroamphetamine. I am also having her start on amphetamine -dextroamphetamine. Additionally, I am having her maintain her loratadine , calcium  carbonate, Multiple Vitamin (MULTIVITAMIN ADULT PO), sertraline , triamcinolone  cream, and trospium .  Meds ordered this encounter  Medications   amphetamine -dextroamphetamine (ADDERALL XR) 30 MG 24 hr capsule    Sig: Take 1 capsule (30 mg total) by mouth daily.    Dispense:  30 capsule    Refill:  0    Supervising Provider:   Randie Bustle A [[id number]]

## 2024-03-19 NOTE — Patient Instructions (Signed)
 VISIT SUMMARY:  Today, we discussed your ongoing challenges with ADHD and the current medication's effectiveness. We also talked about your concerns regarding weight and the importance of adopting healthier habits.  YOUR PLAN:  ATTENTION-DEFICIT HYPERACTIVITY DISORDER (ADHD): You continue to have difficulty initiating tasks on your current dose of Adderall XR, although you feel calmer and less depressed. -Increase Adderall XR to 30 mg once daily. -Monitor for any side effects and report them. -Consider counseling to address procrastination and lack of motivation. -Provided a pamphlet for counseling services.  DEPRESSION: You feel calmer on your current medication but still struggle with motivation and task initiation. -Refer to counseling services for further evaluation and support.  OBESITY: You are concerned about your weight and recognize the need for lifestyle changes. -Set small, achievable dietary and exercise goals. -Discuss potential lifestyle changes with your partner to support mutual health goals.

## 2024-03-27 ENCOUNTER — Ambulatory Visit: Payer: 59 | Admitting: Family

## 2024-04-01 ENCOUNTER — Other Ambulatory Visit: Payer: Self-pay | Admitting: Family

## 2024-04-01 DIAGNOSIS — F418 Other specified anxiety disorders: Secondary | ICD-10-CM

## 2024-04-22 ENCOUNTER — Telehealth: Payer: Self-pay | Admitting: Family

## 2024-04-22 ENCOUNTER — Telehealth: Payer: Self-pay

## 2024-04-22 MED ORDER — AMPHETAMINE-DEXTROAMPHET ER 30 MG PO CP24: 30.0000 mg | ORAL_CAPSULE | Freq: Every day | ORAL | 0 refills | Status: DC

## 2024-04-22 NOTE — Telephone Encounter (Signed)
 duplicate

## 2024-04-22 NOTE — Telephone Encounter (Signed)
 Copied from CRM [id number]. Topic: Clinical - Medication Refill >> Apr 22, 2024  8:34 AM Howard Macho wrote: Medication: amphetamine -dextroamphetamine (ADDERALL XR) 30 MG 24 (the patient is going out of town this morning at 10:30am and need her medication asap)  Has the patient contacted their pharmacy? Yes (Agent: If no, request that the patient contact the pharmacy for the refill. If patient does not wish to contact the pharmacy document the reason why and proceed with request.) (Agent: If yes, when and what did the pharmacy advise?)  This is the patient's preferred pharmacy:  [id number] - [place], [place] - [address] [place] [address] Phone: [phone] Fax: [phone]  Is this the correct pharmacy for this prescription? Yes If no, delete pharmacy and type the correct one.   Has the prescription been filled recently? No  Is the patient out of the medication? Yes  Has the patient been seen for an appointment in the last year OR does the patient have an upcoming appointment? Yes  Can we respond through MyChart? Yes  Agent: Please be advised that Rx refills may take up to 3 business days. We ask that you follow-up with your pharmacy.

## 2024-04-22 NOTE — Telephone Encounter (Signed)
 Initial Comment Caller states she needs a prescription refill. She is not currently having any Sx. Translation No Nurse Assessment Nurse: Elvan Hamel, RN, Shelvy Dickens Date/Time (Eastern Time): 04/20/2024 9:58:43 AM You have opened med assessment, do you wish to continue? ---Yes Please select the assessment type ---Request for controlled medication refill Additional Documentation ---Caller states she needs a refill for Adderall. No new or worsening symptoms, advised to call us  back if she does. Advised patient that Adderall is a controlled drug and cannot be called in after hours. Recommended that she call office on Monday for this refill request. Is there an on-call physician for the client? ---Yes Do the client directives specifically allow for paging the on-call regarding scheduled drugs? ---No Disp. Time [facility] Time) Disposition Final User 04/20/2024 9:47:39 AM Send To RN Personal Dustin Gimenez, RN, Carolynne Citron 04/20/2024 10:03:32 AM Clinical Call Yes Elvan Hamel, RN, Shelvy Dickens Final Disposition 04/20/2024 10:03:32 AM Clinical Call Yes Elvan Hamel, RN, Shelvy Dickens

## 2024-05-22 ENCOUNTER — Other Ambulatory Visit: Payer: Self-pay | Admitting: Obstetrics and Gynecology

## 2024-05-22 ENCOUNTER — Other Ambulatory Visit: Payer: Self-pay | Admitting: Family

## 2024-05-22 NOTE — Telephone Encounter (Unsigned)
 Copied from CRM [id number]. Topic: Clinical - Medication Refill >> May 22, 2024  3:33 PM Berneda F wrote: Medication: amphetamine -dextroamphetamine (ADDERALL XR) 30 MG 24 hr capsule  Has the patient contacted their pharmacy? Yes (Agent: If no, request that the patient contact the pharmacy for the refill. If patient does not wish to contact the pharmacy document the reason why and proceed with request.) (Agent: If yes, when and what did the pharmacy advise?)  This is the patient's preferred pharmacy:  [id number] - [place], [place] - [address] [place] [address] Phone: [phone] Fax: [phone]  Is this the correct pharmacy for this prescription? Yes If no, delete pharmacy and type the correct one.   Has the prescription been filled recently? No  Is the patient out of the medication? No  Has the patient been seen for an appointment in the last year OR does the patient have an upcoming appointment? Yes  Can we respond through MyChart? Yes, or you can call  Agent: Please be advised that Rx refills may take up to 3 business days. We ask that you follow-up with your pharmacy.

## 2024-05-22 NOTE — Telephone Encounter (Signed)
 Requesting: Adderall XR 30mg   Contract: 01/23/24 UDS: 01/23/24 Last Visit: 03/19/24 Next Visit: 06/19/24 Last Refill: 04/22/24 #30 and 0RF   Please Advise

## 2024-05-23 MED ORDER — AMPHETAMINE-DEXTROAMPHET ER 30 MG PO CP24: 30.0000 mg | ORAL_CAPSULE | Freq: Every day | ORAL | 0 refills | Status: DC

## 2024-06-19 ENCOUNTER — Ambulatory Visit (INDEPENDENT_AMBULATORY_CARE_PROVIDER_SITE_OTHER): Admitting: Family

## 2024-06-19 VITALS — BP 138/83 | HR 97 | Temp 98.4°F | Resp 16 | Ht 61.0 in | Wt 185.0 lb

## 2024-06-19 DIAGNOSIS — F909 Attention-deficit hyperactivity disorder, unspecified type: Secondary | ICD-10-CM | POA: Diagnosis not present

## 2024-06-19 DIAGNOSIS — F418 Other specified anxiety disorders: Secondary | ICD-10-CM | POA: Diagnosis not present

## 2024-06-19 MED ORDER — AMPHETAMINE-DEXTROAMPHET ER 30 MG PO CP24: 30.0000 mg | ORAL_CAPSULE | Freq: Every day | ORAL | 0 refills | Status: DC

## 2024-06-19 NOTE — Assessment & Plan Note (Signed)
 Mood is stable with sertraline , continue same.

## 2024-06-19 NOTE — Assessment & Plan Note (Signed)
 Can focus when she finally begins a task, but procrastination persists, causing stress. Financial concerns prevent counseling. - Continue Adderall XR 30 mg. - Encourage setting small, manageable daily goals and using lists.

## 2024-06-19 NOTE — Progress Notes (Signed)
 Subjective:     Patient ID: Traci [name], female    DOB: 1960-11-29, 63 y.o.   MRN: [id number]  Chief Complaint  Patient presents with   ADHD    Here for follow up   Anxiety    Here for follow up    HPI  Discussed the use of AI scribe software for clinical note transcription with the patient, who gave verbal consent to proceed.  History of Present Illness  Traci [name] is a 63 year old female who presents for a routine follow-up regarding focus and mood management.  She is taking Adderall, increased from 20 mg to 30 mg, with no significant improvement in focus. She can focus once a task is started but struggles with initiating tasks, leading to procrastination. More free time has worsened her procrastination due to a lack of pressure.  She is taking sertraline , which has kept her mood stable. She recently enjoyed visiting her son in [place], which was stress-relieving.  She uses lists to manage tasks, finding empowerment in crossing items off, but struggles to maintain this habit consistently.     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  Maintained on Keppra-no seizure activity since surgery   CHOLECYSTECTOMY  03/2007   lap chole   CYSTOSCOPY  12/04/2012   Procedure: CYSTOSCOPY;  Surgeon: Montie SHAUNNA Chesterfield, MD;  Location: [facility];  Service: Gynecology;  Laterality: N/A;   ROBOTIC ASSISTED TOTAL HYSTERECTOMY  12/04/2012   Procedure: ROBOTIC ASSISTED TOTAL HYSTERECTOMY;  Surgeon: Montie SHAUNNA Chesterfield, MD;  Location: [facility];  Service: Gynecology;  Laterality: Bilateral;  with bilateral salpingectomy    Family History  Problem Relation Age of Onset   Osteoarthritis Mother    Osteoporosis Mother    Hypothyroidism Mother    Hashimoto's thyroiditis Mother    Abdominal Wall Hernia Father    Benign prostatic hyperplasia Father    Atrial fibrillation Father    Heart failure Maternal Grandfather    Heart attack Maternal Grandfather    Cancer Paternal Grandfather        colon cancer   Stroke Paternal Grandmother    Hashimoto's thyroiditis Brother     Social History   Socioeconomic History   Marital status: Married    Spouse name: Ozell Donoghue   Number of children: 2   Years of education: Not on file   Highest education level: Bachelor's degree (e.g., BA, AB, BS)  Occupational History   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    Employer: [employer]  Tobacco Use   Smoking status: Never  Smokeless tobacco: Never  Vaping Use   Vaping status: Never Used  Substance and Sexual Activity   Alcohol use: Yes    Alcohol/week: 0.0 - 1.0 standard drinks of alcohol   Drug use: No   Sexual activity: Not Currently    Partners: Male    Birth control/protection: Surgical    Comment: hysterectomy  Other Topics Concern   Not on file  Social History Narrative   2 children   Second marriage   Husband is living in [place]   Works at [employer]   Social Drivers of Longs Drug Stores: Low Risk  (06/18/2024)   Overall Financial Resource Strain (CARDIA)    Difficulty of Paying Living Expenses: Not  hard at all  Food Insecurity: No Food Insecurity (06/18/2024)   Hunger Vital Sign    Worried About Running Out of Food in the Last Year: Never true    Ran Out of Food in the Last Year: Never true  Transportation Needs: No Transportation Needs (06/18/2024)   PRAPARE - Administrator, Civil Service (Medical): No    Lack of Transportation (Non-Medical): No  Physical Activity: Insufficiently Active (06/18/2024)   Exercise Vital Sign    Days of Exercise per Week: 1 day    Minutes of Exercise per Session: 20 min  Stress: Stress Concern Present (06/18/2024)   [employer] of Occupational Health - Occupational Stress Questionnaire    Feeling of Stress: To some extent  Social Connections: Socially Integrated (06/18/2024)   Social Connection and Isolation Panel    Frequency of Communication with Friends and Family: Three times a week    Frequency of Social Gatherings with Friends and Family: Twice a week    Attends Religious Services: More than 4 times per year    Active Member of Clubs or Organizations: Yes    Attends Engineer, structural: More than 4 times per year    Marital Status: Married  Catering manager Violence: Not on file    Outpatient Medications Prior to Visit  Medication Sig Dispense Refill   calcium carbonate (OS-CAL) 600 MG tablet      loratadine (CLARITIN) 10 MG tablet Take 10 mg by mouth daily.     Multiple Vitamin (MULTIVITAMIN ADULT PO) Multivitamin     sertraline (ZOLOFT) 100 MG tablet TAKE 1 TABLET BY MOUTH EVERY DAY 90 tablet 1   triamcinolone cream (KENALOG) 0.5 % Apply topically 2 (two) times daily as needed. 30 g 1   trospium (SANCTURA) 20 MG tablet TAKE 1 TABLET BY MOUTH TWICE A DAY ([facility] OR MAIL) 180 tablet 1   amphetamine-dextroamphetamine (ADDERALL XR) 30 MG 24 hr capsule Take 1 capsule (30 mg total) by mouth daily. 30 capsule 0   No facility-administered medications prior to visit.    Allergies  Allergen Reactions   Pollen  Extract Other (See Comments)    NASAL CONGESTION, SINUS DRAINAGE   Penicillins     childhood   Poison Ivy Extract [Poison Ivy Extract] Hives and Rash    ROS See HPI    Objective:    Physical Exam Constitutional:      General: She is not in acute distress.    Appearance: Normal appearance. She is well-developed.  HENT:     Head: Normocephalic and atraumatic.     Right Ear: External ear normal.     Left Ear: External ear normal.  Eyes:     General: No scleral icterus. Neck:  Thyroid : No thyromegaly.  Cardiovascular:     Rate and Rhythm: Normal rate and regular rhythm.     Heart sounds: Normal heart sounds. No murmur heard. Pulmonary:     Effort: Pulmonary effort is normal. No respiratory distress.     Breath sounds: Normal breath sounds. No wheezing.  Musculoskeletal:     Cervical back: Neck supple.  Skin:    General: Skin is warm and dry.  Neurological:     Mental Status: She is alert and oriented to person, place, and time.  Psychiatric:        Mood and Affect: Mood normal.        Behavior: Behavior normal.        Thought Content: Thought content normal.        Judgment: Judgment normal.      BP 138/83 (BP Location: Right Arm, Patient Position: Sitting, Cuff Size: Normal)   Pulse 97   Temp 98.4 F (36.9 C) (Oral)   Resp 16   Ht 5' 1 (1.549 m)   Wt 185 lb (83.9 kg)   LMP 11/29/2012 (Exact Date)   SpO2 98%   BMI 34.96 kg/m  Wt Readings from Last 3 Encounters:  06/19/24 185 lb (83.9 kg)  03/19/24 190 lb (86.2 kg)  02/13/24 188 lb 3.2 oz (85.4 kg)       Assessment & Plan:   Problem List Items Addressed This Visit       Unprioritized   Depression with anxiety   Mood is stable with sertraline , continue same.       Attention deficit hyperactivity disorder (ADHD) - Primary   Can focus when she finally begins a task, but procrastination persists, causing stress. Financial concerns prevent counseling. - Continue Adderall XR 30 mg. - Encourage  setting small, manageable daily goals and using lists.      Relevant Medications   amphetamine -dextroamphetamine (ADDERALL XR) 30 MG 24 hr capsule    I am having Ceira Hoeschen. Tuohey [name] maintain her loratadine , calcium  carbonate, Multiple Vitamin (MULTIVITAMIN ADULT PO), triamcinolone  cream, sertraline , trospium , and amphetamine -dextroamphetamine.  Meds ordered this encounter  Medications   amphetamine -dextroamphetamine (ADDERALL XR) 30 MG 24 hr capsule    Sig: Take 1 capsule (30 mg total) by mouth daily.    Dispense:  30 capsule    Refill:  0    Supervising Provider:   DOMENICA BLACKBIRD A [[id number]]

## 2024-06-19 NOTE — Patient Instructions (Signed)
 VISIT SUMMARY:  Today, we discussed your focus and mood management. You are continuing with your current medications, Adderall and sertraline, and we talked about strategies to help with procrastination.  YOUR PLAN:  ATTENTION-DEFICIT DISORDER WITHOUT HYPERACTIVITY: You are experiencing difficulty with focus and procrastination despite taking Adderall. -Continue taking Adderall 30 mg. -Set small, manageable daily goals and use lists to help manage tasks.  MOOD: Your mood is stable with sertraline, and you had a positive experience visiting your son in [place]. -Continue taking sertraline.

## 2024-08-01 ENCOUNTER — Other Ambulatory Visit: Payer: Self-pay | Admitting: Family

## 2024-08-01 DIAGNOSIS — F909 Attention-deficit hyperactivity disorder, unspecified type: Secondary | ICD-10-CM

## 2024-08-01 MED ORDER — AMPHETAMINE-DEXTROAMPHET ER 30 MG PO CP24: 30.0000 mg | ORAL_CAPSULE | Freq: Every day | ORAL | 0 refills | Status: DC

## 2024-08-01 NOTE — Telephone Encounter (Signed)
 Copied from CRM #[id number]. Topic: Clinical - Medication Refill >> Aug 01, 2024 11:32 AM [place] wrote: Medication: amphetamine -dextroamphetamine (ADDERALL XR) 30 MG 24 hr capsule  Has the patient contacted their pharmacy? Yes (Agent: If no, request that the patient contact the pharmacy for the refill. If patient does not wish to contact the pharmacy document the reason why and proceed with request.) (Agent: If yes, when and what did the pharmacy advise?)  This is the patient's preferred pharmacy:  [id number] [place] - [address] [place] [address] Phone: [phone] Fax: [phone]  Is this the correct pharmacy for this prescription? Yes If no, delete pharmacy and type the correct one.   Has the prescription been filled recently? Yes  Is the patient out of the medication? Yes  Has the patient been seen for an appointment in the last year OR does the patient have an upcoming appointment? Yes  Can we respond through MyChart? Yes  Agent: Please be advised that Rx refills may take up to 3 business days. We ask that you follow-up with your pharmacy.

## 2024-08-01 NOTE — Telephone Encounter (Signed)
 Requesting: Adderall XR 30mg   Contract: 01/23/24 UDS: 01/23/24 Last Visit: 06/19/24 Next Visit: 12/24/24 Last Refill: 06/19/24 #30 and 0RF   Please Advise

## 2024-08-26 ENCOUNTER — Ambulatory Visit: Payer: 59 | Admitting: Obstetrics and Gynecology

## 2024-08-29 ENCOUNTER — Encounter: Payer: Self-pay | Admitting: Obstetrics and Gynecology

## 2024-08-29 ENCOUNTER — Ambulatory Visit (INDEPENDENT_AMBULATORY_CARE_PROVIDER_SITE_OTHER): Admitting: Obstetrics and Gynecology

## 2024-08-29 VITALS — BP 110/78 | HR 106 | Ht 60.43 in | Wt 191.4 lb

## 2024-08-29 DIAGNOSIS — N952 Postmenopausal atrophic vaginitis: Secondary | ICD-10-CM

## 2024-08-29 DIAGNOSIS — Z01419 Encounter for gynecological examination (general) (routine) without abnormal findings: Secondary | ICD-10-CM | POA: Diagnosis not present

## 2024-08-29 DIAGNOSIS — D229 Melanocytic nevi, unspecified: Secondary | ICD-10-CM

## 2024-08-29 DIAGNOSIS — Z1331 Encounter for screening for depression: Secondary | ICD-10-CM

## 2024-08-29 MED ORDER — ALENDRONATE SODIUM 70 MG PO TABS: 70.0000 mg | ORAL_TABLET | ORAL | 11 refills | Status: AC

## 2024-08-29 MED ORDER — IMVEXXY MAINTENANCE PACK 10 MCG VA INST: 1.0000 | VAGINAL_INSERT | VAGINAL | 0 refills | Status: AC

## 2024-08-29 MED ORDER — ESTRADIOL 0.01 % VA CREA: 1.0000 | TOPICAL_CREAM | Freq: Every day | VAGINAL | 0 refills | Status: AC

## 2024-08-29 NOTE — Progress Notes (Signed)
 63 y.o. y.o. female here for annual exam. Patient's last menstrual period was 11/29/2012 (exact date).   LAVH ovaries intact  female here for annual exam.  H/o hysterectomy ovaries intact. G2P2 son in [place] and Daughter in [place]  She is married to second husband   Patient's last menstrual period was 11/29/2012 (exact date).  Bothersome SUI,  wears daily pads. She has seen urogyn and would like to move forward with the sling placement  Health Maintenance: Pap:  82024 neg HPV HR neg Vaginal Pap: negative, negative HPV History of abnormal Pap:  Yes abnormal smear of anus,  HPV 8/14. No h/o cervical dysplasia. MMG:  02/14/24 BMD:   12/30/21 osteopenic, T score -1.6, FRAX 8.2/0.8% no fractures, mother with osteoporosis. She would like to be proactive and start fosamax and prevent more bone loss.  Discussed r/b/a/I. To not take more than 3 years. Repeat dxa in 2 years. Colonoscopy: 2024 WNL f/u 10 years  TDaP:  10/25/13 Gardasil: NA Health Maintenance: dermatology referral placed for annual check  Body mass index is 36.85 kg/m.     08/29/2024   11:53 AM 06/19/2024    9:38 AM 12/29/2023   10:31 AM  Depression screen PHQ 2/9  Decreased Interest 0 1 2  Down, Depressed, Hopeless 0 2 2  PHQ - 2 Score 0 3 4  Altered sleeping  1 1  Tired, decreased energy  0 2  Change in appetite  0 2  Feeling bad or failure about yourself   2 2  Trouble concentrating  0 1  Moving slowly or fidgety/restless  0 0  Suicidal thoughts  0 0  PHQ-9 Score  6 12  Difficult doing work/chores  Somewhat difficult Somewhat difficult    Blood pressure 110/78, pulse (!) 106, height 5' 0.43 (1.535 m), weight 191 lb 6.4 oz (86.8 kg), last menstrual period 11/29/2012, SpO2 93%.     Component Value Date/Time   DIAGPAP  08/24/2023 1332    - Negative for intraepithelial lesion or malignancy (NILM)   DIAGPAP  06/29/2020 0928    - Negative for intraepithelial lesion or malignancy (NILM)   HPVHIGH Negative  08/24/2023 1332   HPVHIGH Negative 06/29/2020 0928   ADEQPAP Satisfactory for evaluation. 08/24/2023 1332   ADEQPAP Satisfactory for evaluation. 06/29/2020 0928    GYN HISTORY:    Component Value Date/Time   DIAGPAP  08/24/2023 1332    - Negative for intraepithelial lesion or malignancy (NILM)   DIAGPAP  06/29/2020 0928    - Negative for intraepithelial lesion or malignancy (NILM)   HPVHIGH Negative 08/24/2023 1332   HPVHIGH Negative 06/29/2020 0928   ADEQPAP Satisfactory for evaluation. 08/24/2023 1332   ADEQPAP Satisfactory for evaluation. 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 Maintained on Keppra-no seizure activity since surgery   CHOLECYSTECTOMY  03/2007   lap chole   CYSTOSCOPY  12/04/2012   Procedure: CYSTOSCOPY;  Surgeon: Montie SHAUNNA Chesterfield, MD;  Location: [facility];  Service: Gynecology;  Laterality: N/A;   ROBOTIC ASSISTED TOTAL HYSTERECTOMY  12/04/2012   Procedure: ROBOTIC ASSISTED TOTAL HYSTERECTOMY;  Surgeon: Montie SHAUNNA Chesterfield, MD;  Location: [facility];  Service: Gynecology;  Laterality:  Bilateral;  with bilateral salpingectomy    Current Outpatient Medications on File Prior to Visit  Medication Sig Dispense Refill   amphetamine -dextroamphetamine (ADDERALL XR) 30 MG 24 hr capsule Take 1 capsule (30 mg total) by mouth daily. 30 capsule 0   calcium  carbonate (OS-CAL) 600 MG tablet      loratadine  (CLARITIN ) 10 MG tablet Take 10 mg by mouth daily.     Multiple Vitamin (MULTIVITAMIN ADULT PO) Multivitamin     sertraline  (ZOLOFT ) 100 MG tablet TAKE 1 TABLET BY MOUTH EVERY DAY 90 tablet 1   triamcinolone  cream (KENALOG ) 0.5 % Apply topically 2 (two) times daily as needed. 30 g 1   trospium  (SANCTURA ) 20 MG tablet TAKE 1 TABLET BY MOUTH TWICE A DAY ([facility]) 180 tablet 1   No current facility-administered medications on file prior to visit.    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: [employer]  Tobacco Use   Smoking status: Never   Smokeless tobacco: Never  Vaping Use   Vaping status: Never Used  Substance and Sexual Activity   Alcohol use: Yes    Alcohol/week: 0.0 - 1.0 standard drinks of alcohol   Drug use: No   Sexual activity: Not Currently    Partners: Male    Birth control/protection: Surgical    Comment: hysterectomy  Other Topics Concern   Not on file  Social History Narrative   2 children   Second marriage   Husband is living in [place]   Works at [employer]   Social Drivers of Longs Drug Stores: Low Risk  (06/18/2024)   Overall Financial Resource Strain (CARDIA)    Difficulty of Paying Living Expenses: Not hard at all  Food Insecurity: No Food Insecurity (06/18/2024)   Hunger Vital Sign    Worried About Running Out of Food in the Last Year: Never  true    Ran Out of Food in the Last Year: Never true  Transportation Needs: No Transportation Needs (06/18/2024)   PRAPARE - Administrator, Civil Service (Medical): No    Lack of Transportation (Non-Medical): No  Physical Activity: Insufficiently Active (06/18/2024)   Exercise Vital Sign    Days of Exercise per Week: 1 day    Minutes of Exercise per Session: 20 min  Stress: Stress Concern Present (06/18/2024)   [employer] of Occupational Health - Occupational  Stress Questionnaire    Feeling of Stress: To some extent  Social Connections: Socially Integrated (06/18/2024)   Social Connection and Isolation Panel    Frequency of Communication with Friends and Family: Three times a week    Frequency of Social Gatherings with Friends and Family: Twice a week    Attends Religious Services: More than 4 times per year    Active Member of [employer] or Organizations: Yes    Attends Engineer, structural: More than 4 times per year    Marital Status: Married  Catering manager Violence: Not on file    Family History  Problem Relation Age of Onset   Osteoarthritis Mother    Osteoporosis Mother    Hypothyroidism Mother    Hashimoto's thyroiditis Mother    Abdominal Wall Hernia Father    Benign prostatic hyperplasia Father    Atrial fibrillation Father    Heart failure Maternal Grandfather    Heart attack Maternal Grandfather    Cancer Paternal Grandfather        colon cancer   Stroke Paternal Grandmother    Hashimoto's thyroiditis Brother      Allergies  Allergen Reactions   Pollen Extract Other (See Comments)    NASAL CONGESTION, SINUS DRAINAGE   Penicillins     childhood   Poison Ivy Extract [Poison Ivy Extract] Hives and Rash      Patient's last menstrual period was Patient's last menstrual period was 11/29/2012 (exact date)..            Review of Systems Alls systems reviewed and are negative.     Physical Exam Constitutional:      Appearance:  Normal appearance.  Genitourinary:     Vulva normal.     No lesions in the vagina.     Right Labia: No rash, lesions or skin changes.    Left Labia: No lesions, skin changes or rash.    Vaginal cuff intact.    No vaginal discharge or tenderness.     Anterior vaginal prolapse present.    Mild vaginal atrophy present.     Right Adnexa: no mass present.    Left Adnexa: no mass present.    Cervix is not absent.     Uterus is not absent. Breasts:    Right: Normal.     Left: Normal.  HENT:     Head: Normocephalic.  Neck:     Thyroid : No thyroid  mass, thyromegaly or thyroid  tenderness.  Cardiovascular:     Rate and Rhythm: Normal rate and regular rhythm.     Heart sounds: Normal heart sounds, S1 normal and S2 normal.  Pulmonary:     Effort: Pulmonary effort is normal.     Breath sounds: Normal breath sounds and air entry.  Abdominal:     General: Bowel sounds are normal. There is no distension.     Palpations: Abdomen is soft. There is no mass.     Tenderness: There is no abdominal tenderness. There is no guarding or rebound.  Musculoskeletal:     Cervical back: Full passive range of motion without pain, normal range of motion and neck supple. No tenderness.     Right lower leg: No edema.     Left lower leg: No edema.  Neurological:     Mental Status: She is alert.  Skin:    General: Skin is warm.  Psychiatric:        Mood and Affect: Mood normal.  Behavior: Behavior normal.        Thought Content: Thought content normal.  Vitals and nursing note reviewed. Exam conducted with a chaperone present.       A:         Well Woman GYN exam Osteopenia LAVH ovaries intact SUI vaginal dryness                             P:        Pap smear not indicated Encouraged annual mammogram screening Colon cancer screening up-to-date DXA up-to-date Labs and immunizations to do with PMD Discussed breast self exams Encouraged healthy lifestyle practices Encouraged Vit D and  Calcium   Begin vaginal estrogen for bladder support and vaginal dryness. Imvexxy 4mcg sent for 4 days daily then can alternate twice weekly with vaginal estrogen or stay with estrogen based on costs.  No follow-ups on file.  Traci [name]

## 2024-09-11 ENCOUNTER — Telehealth: Payer: Self-pay | Admitting: Family

## 2024-09-13 ENCOUNTER — Other Ambulatory Visit: Payer: Self-pay

## 2024-09-13 DIAGNOSIS — F909 Attention-deficit hyperactivity disorder, unspecified type: Secondary | ICD-10-CM

## 2024-09-13 MED ORDER — AMPHETAMINE-DEXTROAMPHET ER 30 MG PO CP24: 30.0000 mg | ORAL_CAPSULE | Freq: Every day | ORAL | 0 refills | Status: DC

## 2024-09-13 NOTE — Telephone Encounter (Signed)
 Copied from CRM #[id number]. Topic: Clinical - Prescription Issue >> Sep 13, 2024  2:43 PM [name] wrote: Reason for CRM:  Patient called in regarding prescription amphetamine -dextroamphetamine (ADDERALL XR) 30 MG 24 hr capsule  Was called in request 11/05 and hasn't been sent in yet, patient would like it sent as soon as possible

## 2024-09-13 NOTE — Telephone Encounter (Signed)
 Requesting:Adderall XR 30mg   Contract: 01/23/24 UDS:01/23/24 Last Visit: 06/19/24 Next Visit: 12/24/24 Last Refill: 08/01/24 #30 and 0RF   Please Advise

## 2024-10-08 ENCOUNTER — Other Ambulatory Visit: Payer: Self-pay | Admitting: Family

## 2024-10-08 DIAGNOSIS — F418 Other specified anxiety disorders: Secondary | ICD-10-CM

## 2024-10-16 ENCOUNTER — Other Ambulatory Visit: Payer: Self-pay | Admitting: Family

## 2024-10-16 DIAGNOSIS — F909 Attention-deficit hyperactivity disorder, unspecified type: Secondary | ICD-10-CM

## 2024-10-16 MED ORDER — AMPHETAMINE-DEXTROAMPHET ER 30 MG PO CP24: 30.0000 mg | ORAL_CAPSULE | Freq: Every day | ORAL | 0 refills | Status: AC

## 2024-10-16 NOTE — Telephone Encounter (Signed)
 Requesting: Adderall XR 30mg   Contract:02/06/24 UDS:01/23/24 Last Visit: 06/19/24 Next Visit:12/24/24 Last Refill: 09/13/24 #30 and 0RF   Please Advise

## 2024-10-16 NOTE — Telephone Encounter (Signed)
 Copied from CRM #[id number]. Topic: Clinical - Medication Refill >> Oct 16, 2024  9:03 AM [place] MATSU wrote: Medication: amphetamine -dextroamphetamine (ADDERALL XR) 30 MG 24 hr capsule   Has the patient contacted their pharmacy? Yes (Agent: If no, request that the patient contact the pharmacy for the refill. If patient does not wish to contact the pharmacy document the reason why and proceed with request.) (Agent: If yes, when and what did the pharmacy advise?)  This is the patient's preferred pharmacy:  [id number] [place], [place] - [address] [place] [address] Phone: [phone] Fax: [phone]    Is this the correct pharmacy for this prescription? Yes If no, delete pharmacy and type the correct one.   Has the prescription been filled recently? Yes  Is the patient out of the medication? Yes  Has the patient been seen for an appointment in the last year OR does the patient have an upcoming appointment? Yes  Can we respond through MyChart? Yes  Agent: Please be advised that Rx refills may take up to 3 business days. We ask that you follow-up with your pharmacy.

## 2024-10-24 ENCOUNTER — Encounter: Payer: Self-pay | Admitting: Student

## 2024-10-24 ENCOUNTER — Ambulatory Visit: Admitting: Student

## 2024-10-24 ENCOUNTER — Ambulatory Visit: Payer: Self-pay

## 2024-10-24 VITALS — BP 126/85 | HR 108 | Ht 60.5 in | Wt 195.6 lb

## 2024-10-24 DIAGNOSIS — M5416 Radiculopathy, lumbar region: Secondary | ICD-10-CM | POA: Diagnosis not present

## 2024-10-24 DIAGNOSIS — M25552 Pain in left hip: Secondary | ICD-10-CM

## 2024-10-24 DIAGNOSIS — M8580 Other specified disorders of bone density and structure, unspecified site: Secondary | ICD-10-CM | POA: Diagnosis not present

## 2024-10-24 MED ORDER — MELOXICAM 15 MG PO TABS: 15.0000 mg | ORAL_TABLET | Freq: Every day | ORAL | 0 refills | Status: AC | PRN

## 2024-10-24 MED ORDER — VITAMIN D3 25 MCG (1000 UT) PO CAPS: 1000.0000 [IU] | ORAL_CAPSULE | Freq: Every day | ORAL | Status: AC

## 2024-10-24 NOTE — Progress Notes (Unsigned)
 Acute Office Visit  Subjective:     Patient ID: Traci [name], female    DOB: May 27, 1961, 63 y.o.   MRN: [id number]  No chief complaint on file.   Discussed the use of AI scribe software for clinical note transcription with the patient, who gave verbal consent to proceed.   HPI Patient is in today for acute pain of left hip and lumbar radiculopathy.  PMHx- Osteopenia  Denies falls, denies trauma, redness, swelling, erythema. Patient reports left leg pain for approximately one week, described as uncomfortable and painful, 3/10, dull pain.  Pain is localized to the left upper thigh/hip region and radiates along the inner thigh toward the knee. She is able to ambulate but notes persistent discomfort. She denies any recent injury, exercise, fall, or overuse. The cause is unclear to the patient. She has been using over-the-counter pain medications and a heating pad with limited relief. Pain worse in the AM and worse when sitting for long periods then moving hip. Able to bare weight. She denies associated chest pain, shortness of breath, swelling, rash, fever, numbness, or weakness.   Patient denies fever, chills, SOB, CP, palpitations, dyspnea, edema, HA, vision changes, N/V/D, abdominal pain, urinary symptoms, rash, weight changes, and recent illness or hospitalizations.    ROS  See HPI    Objective:    BP 126/85   Pulse (!) 108   Ht 5' 0.5 (1.537 m)   Wt 195 lb 9.6 oz (88.7 kg)   LMP 11/29/2012   SpO2 97%   BMI 37.57 kg/m  {Vitals History (Optional):23777}  Physical Exam Vitals reviewed.  Constitutional:      General: She is not in acute distress.    Appearance: She is not toxic-appearing.  HENT:     Head: Normocephalic and atraumatic.     Mouth/Throat:     Mouth: Mucous membranes are moist.     Pharynx: Oropharynx is clear.  Eyes:     Extraocular Movements: Extraocular movements intact.     Pupils: Pupils are equal, round, and reactive to light.  Cardiovascular:      Rate and Rhythm: Normal rate and regular rhythm.     Pulses: Normal pulses.     Heart sounds: Normal heart sounds. No murmur heard. Pulmonary:     Effort: Pulmonary effort is normal. No respiratory distress.     Breath sounds: Normal breath sounds. No wheezing.  Musculoskeletal:        General: No swelling. Normal range of motion.     Cervical back: Neck supple.     Comments: Normal gait  Skin:    General: Skin is warm and dry.  Neurological:     General: No focal deficit present.     Mental Status: She is alert and oriented to person, place, and time.  Psychiatric:        Mood and Affect: Mood normal.        Behavior: Behavior normal.        Thought Content: Thought content normal.        Judgment: Judgment normal.    Hip Exam: Left-    Tenderness to palpation: mild  Range of Motion: Full ROM    Flexion: Normal    Extension: Normal    Abduction: Normal    Adduction: Normal    Internal rotation: Normal    External rotation: Normal Muscle Strength:  5/5 bilaterally    No results found for any visits on 10/24/24.      Assessment &  Plan:   Problem List Items Addressed This Visit       Nervous and Auditory   Lumbar radiculopathy, acute   Relevant Medications   predniSONE (DELTASONE) 20 MG tablet     Musculoskeletal and Integument   Osteopenia   Relevant Orders   Vitamin D  (25 hydroxy)   Other Visit Diagnoses       Left hip pain    -  Primary   Relevant Medications   predniSONE (DELTASONE) 20 MG tablet   meloxicam  (MOBIC ) 15 MG tablet      Rx. Prednisone and Mobic  May use Tylenol  OTC prn  Medication and common side effects reviewed with the patient; patient voiced understanding and had no further questions at this time.  Recommend rest and activity modification- avoid movements that worsen pain. Apply ice/heat therapy as tolerated for symptom relief. Encourage gentle stretching and low-impact activity, avoid high-impact exercise. Consider imaging if  symptoms persist or worsen  Osteopenia Recently started on fofsamax by GYN Update Vitamin D  level Start 1000 units Vitamin D  daily Last vitamin D  Lab Results  Component Value Date   VD25OH 28.2 (L) 08/03/2017     Meds ordered this encounter  Medications   predniSONE (DELTASONE) 20 MG tablet    Sig: Take 2 tablets (40 mg total) by mouth daily with breakfast for 5 days.    Dispense:  10 tablet    Refill:  0    Supervising Provider:   DOMENICA BLACKBIRD A [[id number]]   meloxicam  (MOBIC ) 15 MG tablet    Sig: Take 1 tablet (15 mg total) by mouth daily as needed for pain.    Dispense:  15 tablet    Refill:  0    Supervising Provider:   DOMENICA BLACKBIRD A [[id number]]   Cholecalciferol (VITAMIN D3) 25 MCG (1000 UT) CAPS    Sig: Take 1 capsule (1,000 Units total) by mouth daily.    Supervising Provider:   DOMENICA BLACKBIRD A [[id number]]    No follow-ups on file.  Traci Iannaccone L [name], NP

## 2024-10-24 NOTE — Telephone Encounter (Signed)
 Appt scheduled

## 2024-10-24 NOTE — Telephone Encounter (Signed)
 FYI Only or Action Required?: FYI only for provider: appointment scheduled on 10/24/24.  Patient was last seen in primary care on 06/19/2024 by Daryl Setter, NP.  Called Nurse Triage reporting Hip Pain.  Symptoms began a week ago.  Interventions attempted: OTC medications: otc pain, heating pad.  Symptoms are: gradually worsening.  Triage Disposition: See HCP Within 4 Hours (Or PCP Triage)  Patient/caregiver understands and will follow disposition?: Yes, but will wait  Copied from CRM [id number]. Topic: Clinical - Red Word Triage >> Oct 24, 2024  8:26 AM Carlyon D wrote: Red Word that prompted transfer to Nurse Triage: L leg hip to side of knee has pain very uncomfortable and painful Reason for Disposition  [1] Thigh or calf pain AND [2] only 1 side AND [3] present > 1 hour  (Exception: Chronic unchanged pain.)  Answer Assessment - Initial Assessment Questions 1. ONSET: When did the pain start?      1 week ago 2. LOCATION: Where is the pain located?      Left upper thigh, radiating to inner leg almost to knee 3. PAIN: How bad is the pain?    (Scale 1-10; or mild, moderate, severe)     Able to walk  4. WORK OR EXERCISE: Has there been any recent work or exercise that involved this part of the body?      no 5. CAUSE: What do you think is causing the leg pain?     unknonw 6. OTHER SYMPTOMS: Do you have any other symptoms? (e.g., chest pain, back pain, breathing difficulty, swelling, rash, fever, numbness, weakness)     Otc pain, heating pad  Protocols used: Leg Pain-A-AH

## 2024-10-25 ENCOUNTER — Ambulatory Visit: Payer: Self-pay | Admitting: Student

## 2024-10-25 LAB — VITAMIN D 25 HYDROXY (VIT D DEFICIENCY, FRACTURES): VITD: 27.45 ng/mL — ABNORMAL LOW (ref 30.00–100.00)

## 2024-11-18 ENCOUNTER — Encounter: Payer: Self-pay | Admitting: *Deleted

## 2024-12-24 ENCOUNTER — Ambulatory Visit: Admitting: Family

## 2024-12-31 ENCOUNTER — Encounter: Payer: 59 | Admitting: Family

## 2025-03-10 ENCOUNTER — Ambulatory Visit: Payer: Self-pay | Admitting: Physician Assistant

## 2025-09-03 ENCOUNTER — Encounter: Admitting: Obstetrics and Gynecology
